# Patient Record
Sex: Female | Born: 1945 | Race: White | Hispanic: No | Smoking: Never smoker
Health system: Southern US, Community
[De-identification: ages and names within clinical notes are randomized; demographics above are authoritative.]

## PROBLEM LIST (undated history)

## (undated) DIAGNOSIS — R7303 Prediabetes: Secondary | ICD-10-CM

## (undated) DIAGNOSIS — K760 Fatty (change of) liver, not elsewhere classified: Secondary | ICD-10-CM

## (undated) DIAGNOSIS — Z8719 Personal history of other diseases of the digestive system: Secondary | ICD-10-CM

## (undated) DIAGNOSIS — I10 Essential (primary) hypertension: Secondary | ICD-10-CM

## (undated) DIAGNOSIS — E559 Vitamin D deficiency, unspecified: Secondary | ICD-10-CM

## (undated) DIAGNOSIS — K635 Polyp of colon: Secondary | ICD-10-CM

## (undated) DIAGNOSIS — K922 Gastrointestinal hemorrhage, unspecified: Secondary | ICD-10-CM

## (undated) DIAGNOSIS — M1990 Unspecified osteoarthritis, unspecified site: Secondary | ICD-10-CM

## (undated) DIAGNOSIS — E669 Obesity, unspecified: Secondary | ICD-10-CM

## (undated) HISTORY — DX: Prediabetes: R73.03

## (undated) HISTORY — PX: FOOT SURGERY: SHX648

## (undated) HISTORY — PX: BACK SURGERY: SHX140

## (undated) HISTORY — DX: Fatty (change of) liver, not elsewhere classified: K76.0

## (undated) HISTORY — DX: Polyp of colon: K63.5

## (undated) HISTORY — DX: Obesity, unspecified: E66.9

## (undated) HISTORY — PX: ROTATOR CUFF REPAIR: SHX139

## (undated) HISTORY — DX: Gastrointestinal hemorrhage, unspecified: K92.2

## (undated) HISTORY — PX: THYROID SURGERY: SHX805

## (undated) HISTORY — PX: ABDOMINAL HYSTERECTOMY: SHX81

## (undated) HISTORY — PX: HERNIA REPAIR: SHX51

---

## 2011-01-09 ENCOUNTER — Ambulatory Visit: Payer: Self-pay | Admitting: Unknown Physician Specialty

## 2011-01-12 ENCOUNTER — Ambulatory Visit: Payer: Self-pay | Admitting: Otolaryngology

## 2012-04-18 ENCOUNTER — Ambulatory Visit: Payer: Self-pay | Admitting: Podiatry

## 2012-05-02 ENCOUNTER — Ambulatory Visit: Payer: Self-pay | Admitting: Podiatry

## 2012-06-27 ENCOUNTER — Ambulatory Visit: Payer: Self-pay | Admitting: Anesthesiology

## 2012-06-27 DIAGNOSIS — Z01810 Encounter for preprocedural cardiovascular examination: Secondary | ICD-10-CM

## 2012-07-02 ENCOUNTER — Ambulatory Visit: Payer: Self-pay | Admitting: Podiatry

## 2012-07-18 ENCOUNTER — Ambulatory Visit: Payer: Self-pay | Admitting: Podiatry

## 2012-12-01 DEATH — deceased

## 2014-09-16 DIAGNOSIS — M75121 Complete rotator cuff tear or rupture of right shoulder, not specified as traumatic: Secondary | ICD-10-CM | POA: Insufficient documentation

## 2014-09-16 DIAGNOSIS — M65811 Other synovitis and tenosynovitis, right shoulder: Secondary | ICD-10-CM | POA: Insufficient documentation

## 2014-09-22 ENCOUNTER — Ambulatory Visit: Payer: Self-pay | Admitting: Surgery

## 2014-10-06 ENCOUNTER — Ambulatory Visit
Admit: 2014-10-06 | Disposition: A | Discharge status: Home / Self Care | Payer: Self-pay | Attending: Surgery | Admitting: Surgery

## 2014-10-06 LAB — CBC WITH DIFFERENTIAL/PLATELET
Basophil #: 0.1 10*3/uL (ref 0.0–0.1)
Basophil %: 2 %
Eosinophil #: 0.1 10*3/uL (ref 0.0–0.7)
Eosinophil %: 2 %
HCT: 40.7 % (ref 35.0–47.0)
HGB: 13 g/dL (ref 12.0–16.0)
LYMPHS PCT: 20 %
Lymphocyte #: 1.1 10*3/uL (ref 1.0–3.6)
MCH: 28.1 pg (ref 26.0–34.0)
MCHC: 31.9 g/dL — ABNORMAL LOW (ref 32.0–36.0)
MCV: 88 fL (ref 80–100)
MONOS PCT: 11.5 %
Monocyte #: 0.6 x10 3/mm (ref 0.2–0.9)
Neutrophil #: 3.5 10*3/uL (ref 1.4–6.5)
Neutrophil %: 64.5 %
Platelet: 213 10*3/uL (ref 150–440)
RBC: 4.63 10*6/uL (ref 3.80–5.20)
RDW: 14 % (ref 11.5–14.5)
WBC: 5.4 10*3/uL (ref 3.6–11.0)

## 2014-10-13 ENCOUNTER — Ambulatory Visit
Admit: 2014-10-13 | Disposition: A | Discharge status: Home / Self Care | Payer: Self-pay | Attending: Surgery | Admitting: Surgery

## 2014-10-15 DIAGNOSIS — M7541 Impingement syndrome of right shoulder: Secondary | ICD-10-CM | POA: Insufficient documentation

## 2014-11-01 NOTE — Op Note (Signed)
PATIENT NAME:  Taylor Morrow, Taylor Morrow MR#:  [id number] DATE OF BIRTH:  1946-02-12  DATE OF PROCEDURE:  10/13/2014  PREOPERATIVE DIAGNOSIS: Massive rotator cuff tear, right shoulder.   POSTOPERATIVE DIAGNOSIS: Massive rotator cuff tear, right shoulder.   PROCEDURE: Arthroscopic subscapularis tendon repair, arthroscopic subacromial decompression, mini-open repair of massive rotator cuff tear, and mini-open repair of biceps tenodesis, right shoulder.  SURGEON: Pascal Lux, M.D.   ANESTHESIA: General endotracheal with an interscalene block, placed by the anesthesiologist.   FINDINGS: As noted above.   COMPLICATIONS: None.   ESTIMATED BLOOD LOSS: Minimal.   TOTAL FLUIDS: 1300 mL of Crystalloid.  TOURNIQUET: None.   DRAINS: None.   CLOSURE: Staples.   BRIEF CLINICAL NOTE: The patient is a 69 year old female with a 6 plus month history of right shoulder pain. Her symptoms have persisted despite medications, activity modification, etc. History and examination consistent with a massive rotator cuff tear, confirmed by MRI scan. She presents at this time for a right shoulder arthroscopy, debridement, decompression, and repair of the massive rotator cuff tear.   DESCRIPTION OF PROCEDURE: The patient underwent placement of an interscalene block in the preoperative holding area. After the block was placed, she had a short burst of approximately 20 beats of V. tach. She was asymptomatic during this event. A stat EKG was obtained which was completely unchanged as compared to her preoperative EKG. Furthermore, after assessment by the anesthesiologist and discussion with the cardiologist on call, clearance was given and she was brought back to the operating room where she was lain in the supine position. After adequate general endotracheal intubation and anesthesia were obtained, she was repositioned in the beach chair position using the beach chair positioner. The right shoulder and upper extremity were  prepped with ChloraPrep solution before being draped sterilely. Preoperative antibiotics were administered. The expected portal sites and incision site were injected with 0.5% Sensorcaine with epinephrine before the camera was placed in the posterior portal. The glenohumeral joint was thoroughly inspected with the findings as described above. An anterior portal was created using an outside in technique. The labrum was probed and found to be intact. There was some fraying anteriorly and superiorly, which was debrided lightly with the full radius resector. Abundant reactive synovial tissue was also debrided using the full radius resector. The large tear of the rotator cuff involving the supraspinatus and infraspinatus tendons was readily identified. In addition, there was a tear of the superior portion of the subscapularis tendon. This area was debrided using the full radius resector. The exposed lesser tuberosity was roughened with a 4 mm acromionizing burr before the torn portion of this tendon was repaired using a single BioKnotless anchor. Subsequent probing of the repair demonstrated excellent stability. The repair also was stable to external rotation of the shoulder. Because the subscapularis tendon had been torn, it was elected to proceed with a biceps tenodesis. Therefore, the biceps tendon was released from its labral anchor using the ArthroCare wand. The instruments were removed from the joint after suctioning the excess fluid.   The camera was repositioned through the posterior portal into the subacromial space. A separate lateral portal was created. The shaver was introduced and a subtotal bursectomy was performed. The periosteal tissues were debrided off the undersurface of the anterior third of the acromion using the ArthroCare wand. The ArthroCare wand also was used to recess the coracoacromial ligament from its attachment along the anterior and lateral margins of the acromion. The acromioplasty was  completed by using  the 4 mm acromionizing burr and removing the undersurface of the anterior third of the acromion. The ArthroCare wand was reintroduced to obtain hemostasis. The instruments were then removed from the subacromial space after suctioning the excess fluid.   An approximately 4 to 5 cm incision was made over the anterolateral aspect of the shoulder, incorporating the superolateral portal site, which had been created using an outside in technique to help with repairing the subscapularis tendon. The incision was carried down through the subcutaneous tissues to expose the deltoid fascia. The raphe between the anterior and middle thirds was developed to provide access into the subacromial space. Additional bursal tissues were debrided sharply with Metzenbaum scissors. The rotator cuff tear was readily identified. The torn margins were debrided with a 15 blade, as was the exposed greater tuberosity. A rongeur was used to lightly roughen the bone to stimulate bleeding and hopefully optimize the healing response of the rotator cuff tear. The tear was configured where it was primarily a horizontal tear where both the supraspinatus and infraspinatus were torn off their insertion of the greater tuberosity. There also was a longitudinal component between the infraspinatus and teres minor. This defect was repaired in side-to-side fashion using #0 Ethibond interrupted sutures. The remainder of the tear was repaired to the greater tuberosity using 3 Biomet 2.9 mm JuggerKnot anchors. Each of the 2 sets of sutures from each anchor were passed through the torn margin and tied securely to effect the repair. Several of these sutures were then brought back out laterally through bone tunnels and tied over bony bridges to reinforce the repair, creating a double row repair. An apparent watertight closure was obtained.   The bicipital groove was identified by palpation. It was opened for approximately 1 to 1.5 cm. The  biceps tendon was retrieved through this defect. The floor of the bicipital groove was roughened with a curette before a fourth Biomet 2.9 mm JuggerKnot anchor was inserted. Both sets of sutures were passed through the tendon and tied securely to effect the tenodesis. The excess biceps tendon was removed with a #15 blade before the bicipital sheath was repaired using a single #0 Ethibond interrupted suture, incorporating the biceps tendon to further reinforce the tenodesis.   The wound was copiously irrigated with sterile saline solution before the deltoid raphe was reapproximated using 2-0 Vicryl interrupted sutures. The subcutaneous tissues were closed in 2 layers using 2-0 Vicryl interrupted sutures before the skin was closed using staples. The portal sites also were closed using staples. A sterile bulky dressing was applied to the shoulder before the arm was placed into a shoulder immobilizer. The patient was then awakened, extubated, and returned to the recovery room in satisfactory condition after tolerating the procedure well.  ____________________________ J. Dorien Chihuahua, MD jjp:sb D: 10/13/2014 12:04:25 ET T: 10/13/2014 13:52:18 ET JOB#: [id number]  cc: Pascal Lux, MD, <Dictator> Pascal Lux MD ELECTRONICALLY SIGNED 10/20/2014 14:46

## 2015-08-05 ENCOUNTER — Encounter: Payer: Self-pay | Admitting: Emergency Medicine

## 2015-08-05 ENCOUNTER — Inpatient Hospital Stay
Admission: EM | Admit: 2015-08-05 | Discharge: 2015-08-07 | DRG: 378 | Disposition: A | Discharge status: Home / Self Care | Payer: Medicare Other | Attending: Internal Medicine | Admitting: Internal Medicine

## 2015-08-05 ENCOUNTER — Observation Stay: Payer: Medicare Other

## 2015-08-05 DIAGNOSIS — K559 Vascular disorder of intestine, unspecified: Secondary | ICD-10-CM | POA: Diagnosis present

## 2015-08-05 DIAGNOSIS — R197 Diarrhea, unspecified: Secondary | ICD-10-CM

## 2015-08-05 DIAGNOSIS — K625 Hemorrhage of anus and rectum: Secondary | ICD-10-CM | POA: Diagnosis present

## 2015-08-05 DIAGNOSIS — E860 Dehydration: Secondary | ICD-10-CM | POA: Diagnosis present

## 2015-08-05 DIAGNOSIS — Z8249 Family history of ischemic heart disease and other diseases of the circulatory system: Secondary | ICD-10-CM

## 2015-08-05 DIAGNOSIS — R109 Unspecified abdominal pain: Secondary | ICD-10-CM

## 2015-08-05 DIAGNOSIS — Z79899 Other long term (current) drug therapy: Secondary | ICD-10-CM | POA: Diagnosis not present

## 2015-08-05 DIAGNOSIS — I10 Essential (primary) hypertension: Secondary | ICD-10-CM | POA: Diagnosis present

## 2015-08-05 DIAGNOSIS — F22 Delusional disorders: Secondary | ICD-10-CM

## 2015-08-05 DIAGNOSIS — K922 Gastrointestinal hemorrhage, unspecified: Secondary | ICD-10-CM | POA: Diagnosis present

## 2015-08-05 HISTORY — DX: Essential (primary) hypertension: I10

## 2015-08-05 LAB — CBC WITH DIFFERENTIAL/PLATELET
Basophils Absolute: 0.1 10*3/uL (ref 0–0.1)
Basophils Relative: 1 %
Eosinophils Absolute: 0.1 10*3/uL (ref 0–0.7)
Eosinophils Relative: 1 %
HEMATOCRIT: 39.5 % (ref 35.0–47.0)
Hemoglobin: 13.3 g/dL (ref 12.0–16.0)
LYMPHS ABS: 1.4 10*3/uL (ref 1.0–3.6)
LYMPHS PCT: 16 %
MCH: 28.8 pg (ref 26.0–34.0)
MCHC: 33.7 g/dL (ref 32.0–36.0)
MCV: 85.5 fL (ref 80.0–100.0)
MONO ABS: 1 10*3/uL — AB (ref 0.2–0.9)
MONOS PCT: 12 %
NEUTROS ABS: 5.9 10*3/uL (ref 1.4–6.5)
Neutrophils Relative %: 70 %
Platelets: 238 10*3/uL (ref 150–440)
RBC: 4.63 MIL/uL (ref 3.80–5.20)
RDW: 13.6 % (ref 11.5–14.5)
WBC: 8.4 10*3/uL (ref 3.6–11.0)

## 2015-08-05 LAB — COMPREHENSIVE METABOLIC PANEL
ALT: 16 U/L (ref 14–54)
ANION GAP: 9 (ref 5–15)
AST: 20 U/L (ref 15–41)
Albumin: 3.9 g/dL (ref 3.5–5.0)
Alkaline Phosphatase: 97 U/L (ref 38–126)
BILIRUBIN TOTAL: 0.8 mg/dL (ref 0.3–1.2)
BUN: 21 mg/dL — AB (ref 6–20)
CO2: 24 mmol/L (ref 22–32)
Calcium: 9.4 mg/dL (ref 8.9–10.3)
Chloride: 105 mmol/L (ref 101–111)
Creatinine, Ser: 0.93 mg/dL (ref 0.44–1.00)
GFR calc Af Amer: >60 mL/min (ref >60)
Glucose, Bld: 103 mg/dL — ABNORMAL HIGH (ref 65–99)
POTASSIUM: 4.1 mmol/L (ref 3.5–5.1)
Sodium: 138 mmol/L (ref 135–145)
TOTAL PROTEIN: 7.5 g/dL (ref 6.5–8.1)

## 2015-08-05 LAB — HEMOGLOBIN AND HEMATOCRIT, BLOOD
HCT: 37.6 % (ref 35.0–47.0)
HCT: 37.4 % (ref 35.0–47.0)
HEMOGLOBIN: 12.2 g/dL (ref 12.0–16.0)
Hemoglobin: 12.6 g/dL (ref 12.0–16.0)

## 2015-08-05 LAB — LIPASE, BLOOD: LIPASE: 27 U/L (ref 11–51)

## 2015-08-05 LAB — ABO/RH: ABO/RH(D): A NEG

## 2015-08-05 MED ORDER — PIPERACILLIN-TAZOBACTAM 3.375 G IVPB
3.3750 g | Freq: Three times a day (TID) | INTRAVENOUS | Status: DC
  Administered 2015-08-05 – 2015-08-07 (×5): 3.375 g via INTRAVENOUS
  Filled 2015-08-05 (×8): qty 50

## 2015-08-05 MED ORDER — SODIUM CHLORIDE 0.9 % IV SOLN
INTRAVENOUS | Status: DC
  Administered 2015-08-05 – 2015-08-06 (×3): via INTRAVENOUS

## 2015-08-05 MED ORDER — ACETAMINOPHEN 325 MG PO TABS: 650.0000 mg | ORAL_TABLET | Freq: Four times a day (QID) | ORAL | Status: DC | PRN

## 2015-08-05 MED ORDER — ONDANSETRON HCL 4 MG/2ML IJ SOLN: 4.0000 mg | Freq: Four times a day (QID) | INTRAMUSCULAR | Status: DC | PRN

## 2015-08-05 MED ORDER — IOHEXOL 240 MG/ML SOLN
25.0000 mL | INTRAMUSCULAR | Status: AC
  Administered 2015-08-05 (×2): 25 mL via ORAL

## 2015-08-05 MED ORDER — ACETAMINOPHEN 650 MG RE SUPP: 650.0000 mg | Freq: Four times a day (QID) | RECTAL | Status: DC | PRN

## 2015-08-05 MED ORDER — ONDANSETRON HCL 4 MG PO TABS: 4.0000 mg | ORAL_TABLET | Freq: Four times a day (QID) | ORAL | Status: DC | PRN

## 2015-08-05 MED ORDER — IOHEXOL 300 MG/ML  SOLN
100.0000 mL | Freq: Once | INTRAMUSCULAR | Status: AC | PRN
  Administered 2015-08-05: 100 mL via INTRAVENOUS

## 2015-08-05 MED ORDER — MORPHINE SULFATE (PF) 2 MG/ML IV SOLN: 2.0000 mg | INTRAVENOUS | Status: DC | PRN

## 2015-08-05 MED ORDER — PANTOPRAZOLE SODIUM 40 MG IV SOLR
40.0000 mg | Freq: Two times a day (BID) | INTRAVENOUS | Status: DC
  Administered 2015-08-05 – 2015-08-07 (×5): 40 mg via INTRAVENOUS
  Filled 2015-08-05 (×5): qty 40

## 2015-08-05 NOTE — ED Notes (Signed)
Called floor to let them know pt on the way up 

## 2015-08-05 NOTE — Consult Note (Signed)
Patient had CT scan showing changes consistent with ischemic colitis.  Should have iv antibiotics for a few days then oral.  Recommend vascular surgery routine consult for tomorrow for follow up and possible Doppler studies of abdomen.

## 2015-08-05 NOTE — Progress Notes (Signed)
[facility] [place] NAME: Taylor Morrow    MR#:  [id number]  DATE OF BIRTH:  January 20, 1946  SUBJECTIVE:   Pt. Here due to rectal bleeding.  No bleeding this a.m. No abdominal pain, N/V.  Hg. Stable.   REVIEW OF SYSTEMS:    Review of Systems  Constitutional: Negative for fever and chills.  HENT: Negative for congestion and tinnitus.   Eyes: Negative for blurred vision and double vision.  Respiratory: Negative for cough, shortness of breath and wheezing.   Cardiovascular: Negative for chest pain, orthopnea and PND.  Gastrointestinal: Positive for blood in stool. Negative for nausea, vomiting, abdominal pain and diarrhea.  Genitourinary: Negative for dysuria and hematuria.  Neurological: Negative for dizziness, sensory change and focal weakness.  All other systems reviewed and are negative.   Nutrition: Clear liquids Tolerating Diet: Yes Tolerating PT:  Ambulatory  DRUG ALLERGIES:  No Known Allergies  VITALS:  Blood pressure 110/68, pulse 86, temperature 98.2 F (36.8 C), temperature source Oral, resp. rate 18, height 5\' 1"  (1.549 m), weight 90.039 kg (198 lb 8 oz), SpO2 96 %.  PHYSICAL EXAMINATION:   Physical Exam  GENERAL:  70 y.o.-year-old patient lying in the bed with no acute distress.  EYES: Pupils equal, round, reactive to light and accommodation. No scleral icterus. Extraocular muscles intact.  HEENT: Head atraumatic, normocephalic. Oropharynx and nasopharynx clear.  NECK:  Supple, no jugular venous distention. No thyroid enlargement, no tenderness.  LUNGS: Normal breath sounds bilaterally, no wheezing, rales, rhonchi. No use of accessory muscles of respiration.  CARDIOVASCULAR: S1, S2 normal. No murmurs, rubs, or gallops.  ABDOMEN: Soft, nontender, nondistended. Bowel sounds present. No organomegaly or mass.  EXTREMITIES: No cyanosis, clubbing or edema b/l.    NEUROLOGIC: Cranial nerves II through XII are intact. No  focal Motor or sensory deficits b/l.   PSYCHIATRIC: The patient is alert and oriented x 3.  SKIN: No obvious rash, lesion, or ulcer.    LABORATORY PANEL:   CBC  Recent Labs Lab 08/05/15 0500 08/05/15 0939  WBC 8.4  --   HGB 13.3 12.6  HCT 39.5 37.6  PLT 238  --    ------------------------------------------------------------------------------------------------------------------  Chemistries   Recent Labs Lab 08/05/15 0500  NA 138  K 4.1  CL 105  CO2 24  GLUCOSE 103*  BUN 21*  CREATININE 0.93  CALCIUM 9.4  AST 20  ALT 16  ALKPHOS 97  BILITOT 0.8   ------------------------------------------------------------------------------------------------------------------  Cardiac Enzymes No results for input(s): TROPONINI in the last 168 hours. ------------------------------------------------------------------------------------------------------------------  RADIOLOGY:  No results found.   ASSESSMENT AND PLAN:   70 year old female with past medical history of hypertension presents to the hospital due to multiple episodes of rectal bleeding.  #1 GI bleed-this is a lower GI bleed given the patient's rectal bleeding. -Suspect this is likely diverticular/hemorrhoidal in nature. Hemoglobin stable. -I will place the patient on clear liquid diet.  -Patient hasn't seen by gastroenterology and they will order a CT scan abdomen and pelvis. -Patient's husband was just treated for C. difficile colitis but I do not clinically suspect that the patient has underlying C. difficile and she is afebrile, has no abdominal pain.    #2 hypertension-blood pressure in the low side. Hold lisinopril for now.    All the records are reviewed and case discussed with Care Management/Social Workerr. Management plans discussed with the patient, family and they are in agreement.  CODE STATUS: Full  DVT Prophylaxis:  Teds and SCDs  TOTAL TIME TAKING CARE OF THIS PATIENT: 30 minutes.   POSSIBLE  D/C IN 1-2 DAYS, DEPENDING ON CLINICAL CONDITION.   Henreitta Leber M.D on 08/05/2015 at 2:06 PM  Between 7am to 6pm - Pager - [phone]  After 6pm go to [email or web address] - password [email or web address] [facility]  Office  [phone]  CC: Primary care physician; Pcp Not In System

## 2015-08-05 NOTE — Consult Note (Signed)
Consultation  Referring Provider:Dr. Pyreddy Primary Care Physician:  Dr. Celso Sickle [facility] Consulting  Gastroenterologist:     Dr. Vira Agar    Reason for Consultation: GI bleed        HPI:   Taylor Morrow is a 70 y.o. female with a known history of hypertension came to the emergency room with complaints of bleeding per rectum since yesterday. She reports onset  yesterday at 12:30 in the afternoon of cramps in the lower stomach bilateral area.  She immediately felt the need to sit on the toilet.  She had an episode of diarrhea, and vomited once.  She continued to have diarrhea associated with "severe gas pains rated 8 out of 10."  This pain almost doubled her over.  She immediately became diaphoretic, weak, felt hot and her husband had to help her  back to the couch.  She had continued episodes of diarrhea, and she was hurting at the lower part of her stomach.  She started to see a fresh blood at 7:30 PM that went on through 10:30 PM.  She went to the emergency room.  She thinks she passed blood 4 times.  Last episode was 0230.  She has less discomfort now but there is a little crampiness, there is tenesmus.  She has been back and forth to the toilet a couple times without results.  A stool study as has been ordered. Hgb 13.3 to 12.2.  Patient has had repeated antibiotics in December for a cold.  She never got diarrhea. Her husband was treated for C difficile 3 weeks ago.  Patient says she was passing formed normal stool up until this acute event yesterday.  She does not believe she has C. difficile. She has been taking ibuprofen 2 pills at night and sometimes in the middle of the night for 2 weeks. She is careful to eat a cracker with it. Yesterday, she ate chicken that she had prepared, and it did sit out for 2 hours before she put it away but she does not believe it was bad.  There has  been no ill contacts.  No medication changes.  Reports history of colon polyps with serial  colonoscopies most recent study 2013 in  [place].  She is due for repeat surveillance colonoscopy in 2018.  She has been feeling well up until yesterday   Past Medical History  Diagnosis Date  . Hypertension     Past Surgical History  Procedure Laterality Date  . Thyroid surgery    . Back surgery    . Rotator cuff repair Right     Family History  Problem Relation Age of Onset  . Hypertension Mother      Social History  Substance Use Topics  . Smoking status: Never Smoker   . Smokeless tobacco: None  . Alcohol Use: No    Prior to Admission medications   Medication Sig Start Date End Date Taking? Authorizing Provider  lisinopril (PRINIVIL,ZESTRIL) 10 MG tablet Take 10 mg by mouth daily.   Yes Historical Provider, MD    Current Facility-Administered Medications  Medication Dose Route Frequency Provider Last Rate Last Dose  . 0.9 %  sodium chloride infusion   Intravenous Continuous Saundra Shelling, MD 125 mL/hr at 08/05/15 0908    . acetaminophen (TYLENOL) tablet 650 mg  650 mg Oral Q6H PRN Saundra Shelling, MD       Or  . acetaminophen (TYLENOL) suppository 650 mg  650 mg Rectal Q6H PRN Pavan Pyreddy,  MD      . morphine 2 MG/ML injection 2 mg  2 mg Intravenous Q4H PRN Pavan Pyreddy, MD      . ondansetron (ZOFRAN) tablet 4 mg  4 mg Oral Q6H PRN Saundra Shelling, MD       Or  . ondansetron (ZOFRAN) injection 4 mg  4 mg Intravenous Q6H PRN Pavan Pyreddy, MD      . pantoprazole (PROTONIX) injection 40 mg  40 mg Intravenous Q12H Pavan Pyreddy, MD   40 mg at 08/05/15 1106    Allergies as of 08/05/2015  . (No Known Allergies)     Review of Systems:    A 12 system review was obtained and all negative except where noted in HPI.    Physical Exam:  Vital signs in last 24 hours: Temp:  [97.8 F (36.6 C)-98.2 F (36.8 C)] 98.2 F (36.8 C) (02/02 1243) Pulse Rate:  [72-95] 86 (02/02 1243) Resp:  [17-20] 18 (02/02 1243) BP: (110-135)/(68-87) 110/68 mmHg (02/02 1243) SpO2:  [94  %-97 %] 96 % (02/02 1243) Weight:  [88.451 kg (195 lb)-90.039 kg (198 lb 8 oz)] 90.039 kg (198 lb 8 oz) (02/02 [id number]) Last BM Date: 08/05/15  General:  Well-developed, well-nourished and in no acute distress Head:  Head without obvious abnormality, atraumatic  Eyes:   Conjunctiva pink, sclera anicteric   ENT:   Mouth free of lesions, mucosa moist  Neck:   Supple w/o thyromegaly or mass, trachea midline, no adenopathy  Lungs: Clear to auscultation bilaterally, respirations unlabored Heart:     Normal S1S2, no rubs, murmurs, gallops. Abdomen: Soft,mild diffuse lower tenderness, no hepatosplenomegaly, hernia, or mass and BS normal Rectal: Deferred Lymph:  No cervical or supraclavicular adenopathy. Extremities:   No edema, cyanosis, or clubbing Skin  Skin color, texture, turgor normal, no rashes or lesions Neuro:  A&O x 3. CNII-XII intact, normal strength Psych:  Appropriate mood and affect.  Data Reviewed:  LAB RESULTS:  Recent Labs  08/05/15 0500 08/05/15 0939 08/05/15 1429  WBC 8.4  --   --   HGB 13.3 12.6 12.2  HCT 39.5 37.6 37.4  PLT 238  --   --    BMET  Recent Labs  08/05/15 0500  NA 138  K 4.1  CL 105  CO2 24  GLUCOSE 103*  BUN 21*  CREATININE 0.93  CALCIUM 9.4   LFT  Recent Labs  08/05/15 0500  PROT 7.5  ALBUMIN 3.9  AST 20  ALT 16  ALKPHOS 97  BILITOT 0.8   PT/INR No results for input(s): LABPROT, INR in the last 72 hours.  STUDIES: Ct Abdomen Pelvis W Contrast  08/05/2015  CLINICAL DATA:  Bleeding per rectum since yesterday. Nausea. abdominal pain. EXAM: CT ABDOMEN AND PELVIS WITH CONTRAST TECHNIQUE: Multidetector CT imaging of the abdomen and pelvis was performed using the standard protocol following bolus administration of intravenous contrast. CONTRAST:  159mL OMNIPAQUE IOHEXOL 300 MG/ML  SOLN COMPARISON:  None. FINDINGS: Lower chest: Scarring at both lung bases. Borderline cardiomegaly, without pericardial or pleural effusion. Surgical changes  at the gastroesophageal junction. Hepatobiliary: Subcentimeter right hepatic lobe low-density lesion is well-circumscribed and likely a cyst. Normal gallbladder, without biliary ductal dilatation. Pancreas: Normal, without mass or ductal dilatation. Spleen: Normal in size, without focal abnormality. Adrenals/Urinary Tract: Normal left adrenal gland. Mild right adrenal nodularity. An upper pole left renal lesion is too small to characterize. A lower pole left renal lesion measures 3.4 cm and is consistent with a cyst.  No hydronephrosis. Minimal air in the nondependent bladder. Stomach/Bowel: Proximal gastric underdistention. Scattered colonic diverticula. Wall thickening involves the splenic flexure and descending colon. This is moderate. Mild surrounding edema. Normal terminal ileum. Appendix not visualized, but no pericecal inflammation identified. Normal small bowel. Vascular/Lymphatic: Normal caliber of the aorta and branch vessels. Celiac and mesenteric vessels patent, without significant atherosclerosis. No venous thrombus identified. No abdominopelvic adenopathy. Reproductive: Normal uterus and adnexa. Other: No significant free fluid. No free intraperitoneal air. pelvic floor laxity. Musculoskeletal: Degenerative disc disease at the lumbosacral junction. S shaped thoracolumbar spine curvature. IMPRESSION: 1. Left-sided colitis. Clinical history and distribution favor ischemia. No evidence arterial thrombus or venous occlusion. 2. Air within the urinary bladder, likely iatrogenic. Correlate with instrumentation. Electronically Signed   By: Abigail Miyamoto M.D.   On: 08/05/2015 15:33     Assessment:  Taylor Morrow is a 70 y.o. with a known history of hypertension came to the emergency room with complaints of lower abdominal cramps, diarrhea,  bleeding per rectum since yesterday. She had diaphorase, weakness and 8/10 cramps bilateral  preceeding the bleeding. She has HTN.  Patient had nausea and one episode of  vomiting.   Plan:  R/O ischemic colitis, obtain studies and GI panel ordered. Further orders pending.   This case was discussed with Dr. Manya Silvas in collaboration of care. Thank you for the consultation.  These services provided by Denice Paradise RN, MSN, ANP-BC under collaborative practice agreement with Manya Silvas, MD.  08/05/2015, 3:52 PM

## 2015-08-05 NOTE — ED Notes (Signed)
Patient ambulatory to triage with steady gait, without difficulty or distress noted; pt reports since yesterday having diarrhea, now bloody with lower abd pain; denies hx of same

## 2015-08-05 NOTE — H&P (Addendum)
[facility] [place] NAME: Taylor Morrow    MR#:  [id number]  DATE OF BIRTH:  1946/06/12  DATE OF ADMISSION:  08/05/2015  PRIMARY CARE PHYSICIAN: Pcp Not In System   REQUESTING/REFERRING PHYSICIAN:   CHIEF COMPLAINT:   Chief Complaint  Patient presents with  . Rectal Bleeding    HISTORY OF PRESENT ILLNESS: Taylor Morrow  is a 70 y.o. female with a known history of hypertension came to the emergency room with complaints of bleeding per rectum since yesterday. Patient had nausea since yesterday. No complaints of any vomiting. She noticed 3-4 episodes of bleeding per rectum since yesterday. It was accompanied by cramping abdominal pain. Yesterday patient had loose stool and later on further episodes she just had bright red blood per rectum. The cramping abdominal pain is 4 out of 10 on a scale of 1-10. No radiation of the pain noted. Patient last had colonoscopy in 2013 which was a normal study. Colonoscopy done prior to that, she had polyps removed. Patient takes ibuprofen daily at nighttime for the last 2 weeks for pain. No history of headache, dizziness, blurry vision. No history of syncope or seizure. During the workup in the emergency room patient's hemoglobin around 13. Patient's husband was recently treated for C. difficile colitis and completed the course of medication 3 weeks ago.   PAST MEDICAL HISTORY:   Past Medical History  Diagnosis Date  . Hypertension     PAST SURGICAL HISTORY: Past Surgical History  Procedure Laterality Date  . Thyroid surgery    . Back surgery    . Rotator cuff repair Right     SOCIAL HISTORY:  Social History  Substance Use Topics  . Smoking status: Never Smoker   . Smokeless tobacco: Not on file  . Alcohol Use: No    FAMILY HISTORY:  Family History  Problem Relation Age of Onset  . Hypertension Mother     DRUG ALLERGIES: No Known Allergies  REVIEW OF SYSTEMS:   CONSTITUTIONAL: No fever,  has weakness.  EYES: No blurred or double vision.  EARS, NOSE, AND THROAT: No tinnitus or ear pain.  RESPIRATORY: No cough, shortness of breath, wheezing or hemoptysis.  CARDIOVASCULAR: No chest pain, orthopnea, edema.  GASTROINTESTINAL: Has nausea, no vomiting, had two episodes of diarrhea and crampy abdominal pain noted. GENITOURINARY: No dysuria, hematuria.  ENDOCRINE: No polyuria, nocturia,  HEMATOLOGY: Has rectal bleed. SKIN: No rash or lesion. MUSCULOSKELETAL: No joint pain or arthritis.   NEUROLOGIC: No tingling, numbness, weakness.  PSYCHIATRY: No anxiety or depression.   MEDICATIONS AT HOME:  Prior to Admission medications   Medication Sig Start Date End Date Taking? Authorizing Provider  lisinopril (PRINIVIL,ZESTRIL) 10 MG tablet Take 10 mg by mouth daily.   Yes Historical Provider, MD      PHYSICAL EXAMINATION:   VITAL SIGNS: Blood pressure 135/85, pulse 72, temperature 98 F (36.7 C), temperature source Oral, resp. rate 20, height 5\' 1"  (1.549 m), weight 88.451 kg (195 lb), SpO2 97 %.  GENERAL:  70 y.o.-year-old patient lying in the bed with no acute distress.  EYES: Pupils equal, round, reactive to light and accommodation. No scleral icterus.No pallor noted. Extraocular muscles intact.  HEENT: Head atraumatic, normocephalic. Oropharynx and nasopharynx clear.  NECK:  Supple, no jugular venous distention. No thyroid enlargement, no tenderness.  LUNGS: Normal breath sounds bilaterally, no wheezing, rales,rhonchi or crepitation. No use of accessory muscles of respiration.  CARDIOVASCULAR: S1, S2 normal. No murmurs,  rubs, or gallops.  ABDOMEN: Soft, nontender, nondistended. Decreased Bowel sounds noted.No organomegaly or mass.  EXTREMITIES: No pedal edema, cyanosis, or clubbing.  NEUROLOGIC: Cranial nerves II through XII are intact. Muscle strength 5/5 in all extremities. Sensation intact. Gait not checked.  PSYCHIATRIC: The patient is alert and oriented x 3.  SKIN: No  obvious rash, lesion, or ulcer.   LABORATORY PANEL:   CBC  Recent Labs Lab 08/05/15 0500  WBC 8.4  HGB 13.3  HCT 39.5  PLT 238  MCV 85.5  MCH 28.8  MCHC 33.7  RDW 13.6  LYMPHSABS 1.4  MONOABS 1.0*  EOSABS 0.1  BASOSABS 0.1   ------------------------------------------------------------------------------------------------------------------  Chemistries   Recent Labs Lab 08/05/15 0500  NA 138  K 4.1  CL 105  CO2 24  GLUCOSE 103*  BUN 21*  CREATININE 0.93  CALCIUM 9.4  AST 20  ALT 16  ALKPHOS 97  BILITOT 0.8   ------------------------------------------------------------------------------------------------------------------ estimated creatinine clearance is 56.1 mL/min (by C-G formula based on Cr of 0.93). ------------------------------------------------------------------------------------------------------------------ No results for input(s): TSH, T4TOTAL, T3FREE, THYROIDAB in the last 72 hours.  Invalid input(s): FREET3   Coagulation profile No results for input(s): INR, PROTIME in the last 168 hours. ------------------------------------------------------------------------------------------------------------------- No results for input(s): DDIMER in the last 72 hours. -------------------------------------------------------------------------------------------------------------------  Cardiac Enzymes No results for input(s): CKMB, TROPONINI, MYOGLOBIN in the last 168 hours.  Invalid input(s): CK ------------------------------------------------------------------------------------------------------------------ Invalid input(s): POCBNP  ---------------------------------------------------------------------------------------------------------------  Urinalysis No results found for: COLORURINE, APPEARANCEUR, LABSPEC, PHURINE, GLUCOSEU, HGBUR, BILIRUBINUR, KETONESUR, PROTEINUR, UROBILINOGEN, NITRITE, LEUKOCYTESUR   RADIOLOGY: No results found.  EKG: Orders  placed or performed in visit on 06/27/12  . EKG 12-Lead    IMPRESSION AND PLAN: 70 year old female patient with history of hypertension presented to the emergency room with abdominal pain, nausea and rectal bleed. Admitting diagnosis 1. Acute gastrointestinal bleeding 2. Dehydration 3. Abdominal pain 4. Hypertension 5. Possible NSAID-induced gastritis. Treatment plan Admit patient to medical floor under observation IV fluid hydration IV Protonix 40 mg every 12 hourly Gastroenterology consultation for possible EGD and colonoscopy Hold blood thinner medication Serial hemoglobin and hematocrit monitoring Antiemetics Pain management with morphine intravenously as needed PRBC transfusion if needed Supportive care.  All the records are reviewed and case discussed with ED provider. Management plans discussed with the patient, family and they are in agreement.  CODE STATUS:FULL Code Status History    This patient does not have a recorded code status. Please follow your organizational policy for patients in this situation.       TOTAL TIME TAKING CARE OF THIS PATIENT: 50 minutes.    Saundra Shelling M.D on 08/05/2015 at 6:08 AM  Between 7am to 6pm - Pager - [phone]  After 6pm go to [email or web address] - password [email or web address] [facility]  Office  [phone]  CC: Primary care physician; Pcp Not In System

## 2015-08-05 NOTE — ED Notes (Signed)
Pt reports having diarrhea yesterday with weakness, diaphoresis, nausea/vomiting. Pt reports having bloody beginning 7:30 yesterday evening, and 3 more instances occuring since then. Pt reports her stool is formed, but red. Pt reports 8 out of 10 pain only when defecating. Pt denies any current pain.

## 2015-08-05 NOTE — Consult Note (Signed)
Pharmacy Antibiotic Note  Taylor Morrow is a 70 y.o. female admitted on 08/05/2015 with ischemic colitis.  Pharmacy has been consulted for zosyn dosing.  Plan: Zosyn 3.375g IV q8h (4 hour infusion).  Height: 5\' 1"  (154.9 cm) Weight: 198 lb 8 oz (90.039 kg) IBW/kg (Calculated) : 47.8  Temp (24hrs), Avg:98 F (36.7 C), Min:97.8 F (36.6 C), Max:98.2 F (36.8 C)   Recent Labs Lab 08/05/15 0500  WBC 8.4  CREATININE 0.93    Estimated Creatinine Clearance: 56.7 mL/min (by C-G formula based on Cr of 0.93).    No Known Allergies  Antimicrobials this admission:   Dose adjustments this admission:   Microbiology results:   Thank you for allowing pharmacy to be a part of this patient's care.  Ramond Dial 08/05/2015 4:46 PM

## 2015-08-05 NOTE — Plan of Care (Signed)
Problem: Bowel/Gastric: Goal: Will show no signs and symptoms of gastrointestinal bleeding Outcome: Progressing 1xsmear of brown with red streaks stool during the shift.   Problem: Fluid Volume: Goal: Will show no signs and symptoms of excessive bleeding Outcome: Progressing VSS. Last Hgb 12.2

## 2015-08-05 NOTE — Progress Notes (Signed)
Pastoral Care and Prayer provided. °

## 2015-08-05 NOTE — ED Notes (Signed)
Pt informed stool sample needed. Pt reports she will inform nurse when she is able to have a bowel movement

## 2015-08-05 NOTE — ED Notes (Signed)
Pt reports her husband was diagnosed with CDif in the beginning of January, taking his last dose of antibiotics 2 weeks ago.

## 2015-08-05 NOTE — ED Provider Notes (Addendum)
[facility] Emergency Department Provider Note  ____________________________________________  Time seen: Approximately 4:50 AM  I have reviewed the triage vital signs and the nursing notes.   HISTORY  Chief Complaint Rectal Bleeding    HPI Taylor Morrow is a 70 y.o. female with no significant past medical history and no prior history of gastrointestinal bleeding who presents with multiple episodes of diarrhea for about 2 days with the recent onset of bright red blood per rectum over the last several episodes.  It is accompanied with mild to moderate generalized abdominal cramping that seems to be associated with the bowel movements.  Nothing makes it better and nothing makes it worse.  The number of stools have been numerous and initially they were simply loosened poorly formed but they have developed an increasing amount of red blood and then the last episode she had was a small amount but it seemed to be "pure blood".  She notes that she could notquite make it to the toilet and she dripped blood on the floor after point down her pants.  She has had some nausea and some vomiting yesterday but the vomiting has resolved although there is some persistent mild nausea.  She denies fever/chills, chest pain, shortness of breath.  Of note, her husband finished antibiotics about 2 weeks ago after he had a severe bout of C. difficile due to taking antibiotics for a respiratory infection.  He has tested negative and her symptoms only started 2 days ago.  She notes that she is due for a colonoscopy next year and that she has had polyps in the past.   History reviewed. No pertinent past medical history.  There are no active problems to display for this patient.   Past Surgical History  Procedure Laterality Date  . Thyroid surgery    . Back surgery    . Rotator cuff repair Right     Current Outpatient Rx  Name  Route  Sig  Dispense  Refill  . lisinopril  (PRINIVIL,ZESTRIL) 10 MG tablet   Oral   Take 10 mg by mouth daily.           Allergies Review of patient's allergies indicates no known allergies.  No family history on file.  Social History Social History  Substance Use Topics  . Smoking status: Never Smoker   . Smokeless tobacco: None  . Alcohol Use: No    Review of Systems Constitutional: No fever/chills Eyes: No visual changes. ENT: No sore throat. Cardiovascular: Denies chest pain. Respiratory: Denies shortness of breath. Gastrointestinal: Abdominal cramping, several episodes of vomiting but now just nausea, and numerous episodes of diarrhea with gross blood. Genitourinary: Negative for dysuria. Musculoskeletal: Negative for back pain. Skin: Negative for rash. Neurological: Negative for headaches, focal weakness or numbness.  10-point ROS otherwise negative.  ____________________________________________   PHYSICAL EXAM:  VITAL SIGNS: ED Triage Vitals  Enc Vitals Group     BP 08/05/15 0425 135/85 mmHg     Pulse Rate 08/05/15 0425 78     Resp 08/05/15 0425 20     Temp 08/05/15 0425 98 F (36.7 C)     Temp Source 08/05/15 0425 Oral     SpO2 08/05/15 0425 95 %     Weight 08/05/15 0425 195 lb (88.451 kg)     Height 08/05/15 0425 5\' 1"  (1.549 m)     Head Cir --      Peak Flow --      Pain Score 08/05/15 0425  6     Pain Loc --      Pain Edu? --      Excl. in [place]? --     Constitutional: Alert and oriented. Well appearing and in no acute distress. Eyes: Conjunctivae are normal. PERRL. EOMI. Head: Atraumatic. Nose: No congestion/rhinnorhea. Mouth/Throat: Mucous membranes are moist.  Oropharynx non-erythematous. Neck: No stridor.   Cardiovascular: Normal rate, regular rhythm. Grossly normal heart sounds.  Good peripheral circulation. Respiratory: Normal respiratory effort.  No retractions. Lungs CTAB. Gastrointestinal: Soft and nontender. No distention. No abdominal bruits. No CVA tenderness. Rectal:   Non-thrombosed, non-bleeding external hemorrhoid.  Non-tender exam.  Gross red blood on digital exam. Musculoskeletal: No lower extremity tenderness nor edema.  No joint effusions. Neurologic:  Normal speech and language. No gross focal neurologic deficits are appreciated.  Skin:  Skin is warm, dry and intact. No rash noted. Psychiatric: Mood and affect are normal. Speech and behavior are normal.  ____________________________________________   LABS (all labs ordered are listed, but only abnormal results are displayed)  Labs Reviewed  CBC WITH DIFFERENTIAL/PLATELET - Abnormal; Notable for the following:    Monocytes Absolute 1.0 (*)    All other components within normal limits  GASTROINTESTINAL PANEL BY PCR, STOOL (REPLACES STOOL CULTURE)  C DIFFICILE QUICK SCREEN W PCR REFLEX  COMPREHENSIVE METABOLIC PANEL  LIPASE, BLOOD  TYPE AND SCREEN   ____________________________________________  EKG  None ____________________________________________  RADIOLOGY   No results found.  ____________________________________________   PROCEDURES  Procedure(s) performed: None  Critical Care performed: No ____________________________________________   INITIAL IMPRESSION / ASSESSMENT AND PLAN / ED COURSE  Pertinent labs & imaging results that were available during my care of the patient were reviewed by me and considered in my medical decision making (see chart for details).  Given infectious diarrhea exposure, will check GI pathogen panel including C. Diff to eval for enterohemorrhagic bacterial infection and c diff.  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  Given the gross blood present on rectal exam, I will admit for further management.  H/H stable at this time.  ----------------------------------------- 6:12 AM on 08/05/2015 -----------------------------------------  Dr. Estanislado Pandy and I discussed it, and given the lack of patient's infectious symptoms and strong  probability this is bacterial (including C. Diff), we have canceled the precautions and stool studies.  She may go to a regular bed.  ____________________________________________  FINAL CLINICAL IMPRESSION(S) / ED DIAGNOSES  Final diagnoses:  Rectal bleeding  Abdominal cramping  Diarrhea, unspecified type      NEW MEDICATIONS STARTED DURING THIS VISIT:  New Prescriptions   No medications on file     Hinda Kehr, MD 08/05/15 [id number]  Hinda Kehr, MD 08/05/15 [phone]

## 2015-08-06 DIAGNOSIS — K625 Hemorrhage of anus and rectum: Secondary | ICD-10-CM | POA: Diagnosis not present

## 2015-08-06 LAB — CBC
HEMATOCRIT: 37.4 % (ref 35.0–47.0)
HEMOGLOBIN: 12.4 g/dL (ref 12.0–16.0)
MCH: 29.2 pg (ref 26.0–34.0)
MCHC: 33.1 g/dL (ref 32.0–36.0)
MCV: 88.3 fL (ref 80.0–100.0)
Platelets: 217 10*3/uL (ref 150–440)
RBC: 4.23 MIL/uL (ref 3.80–5.20)
RDW: 14.1 % (ref 11.5–14.5)
WBC: 6.3 10*3/uL (ref 3.6–11.0)

## 2015-08-06 LAB — BASIC METABOLIC PANEL
Anion gap: 7 (ref 5–15)
BUN: 11 mg/dL (ref 6–20)
CO2: 25 mmol/L (ref 22–32)
Calcium: 8.9 mg/dL (ref 8.9–10.3)
Chloride: 107 mmol/L (ref 101–111)
Creatinine, Ser: 0.94 mg/dL (ref 0.44–1.00)
GFR calc Af Amer: >60 mL/min (ref >60)
GFR, EST NON AFRICAN AMERICAN: 60 mL/min — AB (ref >60)
GLUCOSE: 134 mg/dL — AB (ref 65–99)
POTASSIUM: 3.7 mmol/L (ref 3.5–5.1)
Sodium: 139 mmol/L (ref 135–145)

## 2015-08-06 LAB — TYPE AND SCREEN
ABO/RH(D): A NEG
Antibody Screen: NEGATIVE

## 2015-08-06 NOTE — Consult Note (Signed)
Patient feeling much better with no signif tenderness.  No bleeding. I think she can go home tomorrow and follow up with me as out patient.  Probable ischemic colitis, may do colonoscopy in a few weeks.

## 2015-08-06 NOTE — Consult Note (Signed)
Pharmacy Antibiotic Note  CHERA FLADUNG is a 70 y.o. female admitted on 08/05/2015 with ischemic colitis.  Pharmacy has been consulted for zosyn dosing.  Plan: Zosyn 3.375g IV q8h (4 hour infusion).  Height: 5\' 1"  (154.9 cm) Weight: 198 lb 8 oz (90.039 kg) IBW/kg (Calculated) : 47.8  Temp (24hrs), Avg:98.3 F (36.8 C), Min:98.2 F (36.8 C), Max:98.3 F (36.8 C)   Recent Labs Lab 08/05/15 0500 08/06/15 0640  WBC 8.4 6.3  CREATININE 0.93 0.94    Estimated Creatinine Clearance: 56.1 mL/min (by C-G formula based on Cr of 0.94).    No Known Allergies  Antimicrobials this admission:   Dose adjustments this admission:   Microbiology results:   Thank you for allowing pharmacy to be a part of this patient's care.  Loden Laurent D 08/06/2015 8:41 AM

## 2015-08-06 NOTE — Progress Notes (Signed)
[place] [facility] [place] NAME: Taylor Morrow    MR#:  [id number]  DATE OF BIRTH:  [birth year]  SUBJECTIVE:   Pt. Here due to rectal bleeding.  No further bleeding overnight.  No abdominal pain, N/V.  Hg. Stable. CT yesterday showing ischemic colitis.   REVIEW OF SYSTEMS:    Review of Systems  Constitutional: Negative for fever and chills.  HENT: Negative for congestion and tinnitus.   Eyes: Negative for blurred vision and double vision.  Respiratory: Negative for cough, shortness of breath and wheezing.   Cardiovascular: Negative for chest pain, orthopnea and PND.  Gastrointestinal: Positive for blood in stool. Negative for nausea, vomiting, abdominal pain and diarrhea.  Genitourinary: Negative for dysuria and hematuria.  Neurological: Negative for dizziness, sensory change and focal weakness.  All other systems reviewed and are negative.   Nutrition: Clear liquids Tolerating Diet: Yes Tolerating PT:  Ambulatory  DRUG ALLERGIES:  No Known Allergies  VITALS:  Blood pressure 122/62, pulse 71, temperature 98.3 F (36.8 C), temperature source Oral, resp. rate 18, height 5\' 1"  (1.549 m), weight 90.039 kg (198 lb 8 oz), SpO2 97 %.  PHYSICAL EXAMINATION:   Physical Exam  GENERAL:  70 y.o.-year-old patient lying in the bed with no acute distress.  EYES: Pupils equal, round, reactive to light and accommodation. No scleral icterus. Extraocular muscles intact.  HEENT: Head atraumatic, normocephalic. Oropharynx and nasopharynx clear.  NECK:  Supple, no jugular venous distention. No thyroid enlargement, no tenderness.  LUNGS: Normal breath sounds bilaterally, no wheezing, rales, rhonchi. No use of accessory muscles of respiration.  CARDIOVASCULAR: S1, S2 normal. No murmurs, rubs, or gallops.  ABDOMEN: Soft, nontender, nondistended. Bowel sounds present. No organomegaly or mass.  EXTREMITIES: No cyanosis, clubbing or edema b/l.     NEUROLOGIC: Cranial nerves II through XII are intact. No focal Motor or sensory deficits b/l.   PSYCHIATRIC: The patient is alert and oriented x 3.  SKIN: No obvious rash, lesion, or ulcer.    LABORATORY PANEL:   CBC  Recent Labs Lab 08/06/15 0640  WBC 6.3  HGB 12.4  HCT 37.4  PLT 217   ------------------------------------------------------------------------------------------------------------------  Chemistries   Recent Labs Lab 08/05/15 0500 08/06/15 0640  NA 138 139  K 4.1 3.7  CL 105 107  CO2 24 25  GLUCOSE 103* 134*  BUN 21* 11  CREATININE 0.93 0.94  CALCIUM 9.4 8.9  AST 20  --   ALT 16  --   ALKPHOS 97  --   BILITOT 0.8  --    ------------------------------------------------------------------------------------------------------------------  Cardiac Enzymes No results for input(s): TROPONINI in the last 168 hours. ------------------------------------------------------------------------------------------------------------------  RADIOLOGY:  Ct Abdomen Pelvis W Contrast  08/05/2015  CLINICAL DATA:  Bleeding per rectum since yesterday. Nausea. abdominal pain. EXAM: CT ABDOMEN AND PELVIS WITH CONTRAST TECHNIQUE: Multidetector CT imaging of the abdomen and pelvis was performed using the standard protocol following bolus administration of intravenous contrast. CONTRAST:  130mL OMNIPAQUE IOHEXOL 300 MG/ML  SOLN COMPARISON:  None. FINDINGS: Lower chest: Scarring at both lung bases. Borderline cardiomegaly, without pericardial or pleural effusion. Surgical changes at the gastroesophageal junction. Hepatobiliary: Subcentimeter right hepatic lobe low-density lesion is well-circumscribed and likely a cyst. Normal gallbladder, without biliary ductal dilatation. Pancreas: Normal, without mass or ductal dilatation. Spleen: Normal in size, without focal abnormality. Adrenals/Urinary Tract: Normal left adrenal gland. Mild right adrenal nodularity. An upper pole left renal lesion is  too small to characterize. A lower  pole left renal lesion measures 3.4 cm and is consistent with a cyst. No hydronephrosis. Minimal air in the nondependent bladder. Stomach/Bowel: Proximal gastric underdistention. Scattered colonic diverticula. Wall thickening involves the splenic flexure and descending colon. This is moderate. Mild surrounding edema. Normal terminal ileum. Appendix not visualized, but no pericecal inflammation identified. Normal small bowel. Vascular/Lymphatic: Normal caliber of the aorta and branch vessels. Celiac and mesenteric vessels patent, without significant atherosclerosis. No venous thrombus identified. No abdominopelvic adenopathy. Reproductive: Normal uterus and adnexa. Other: No significant free fluid. No free intraperitoneal air. pelvic floor laxity. Musculoskeletal: Degenerative disc disease at the lumbosacral junction. S shaped thoracolumbar spine curvature. IMPRESSION: 1. Left-sided colitis. Clinical history and distribution favor ischemia. No evidence arterial thrombus or venous occlusion. 2. Air within the urinary bladder, likely iatrogenic. Correlate with instrumentation. Electronically Signed   By: Abigail Miyamoto M.D.   On: 08/05/2015 15:33     ASSESSMENT AND PLAN:   70 year old female with past medical history of hypertension presents to the hospital due to multiple episodes of rectal bleeding.  #1 GI bleed-this is a lower GI bleed given the patient's rectal bleeding. - CT head showing ischemic colitis but clinically pt. Is doing well.  - cont. IV Zosyn and tolerated clear liquid diet well and will advance to full liquid today.  - await further Vascular surgery input.   #2 hypertension- normotensive. Hold lisinopril for now.  If Hg. Stable and no abdominal pain or bleeding overnight then likely d/c home tomorrow.   All the records are reviewed and case discussed with Care Management/Social Workerr. Management plans discussed with the patient, family and they are  in agreement.  CODE STATUS: Full  DVT Prophylaxis: Teds and SCDs  TOTAL TIME TAKING CARE OF THIS PATIENT: 30 minutes.   POSSIBLE D/C IN 1-2 DAYS, DEPENDING ON CLINICAL CONDITION.  Greater than 50% of time spent in coordination of care and discussion with the patient, vascular surgery and gastroenterology.   Henreitta Leber M.D on 08/06/2015 at 1:12 PM  Between 7am to 6pm - Pager - [phone]  After 6pm go to [email or web address] - password [email or web address] [facility]  Office  [phone]  CC: Primary care physician; Pcp Not In System

## 2015-08-06 NOTE — Consult Note (Signed)
[facility] Vascular Consult Note  MRN : [id number]  Taylor Morrow is a 70 y.o. ([birth year]) female who presents with chief complaint of  Chief Complaint  Patient presents with  . Rectal Bleeding  .  History of Present Illness: Patient admitted yesterday with blood per rectum two nights ago.  Asked by Dr. Verdell Carmine of the hospitalist service to see the patient and comment on her vascular supply to the colon.  She denies any antecedent weight loss, food fear, or abdominal pain.  The discomfort at this time is crampy and achy and in the lower abdomen.  She does have urgency to defecate, but her output has been low.  No further bleeding here. She had a CT scan which I have independently reviewed.  She  Has left sided colitis and ischemic colitis is certainly in the differential diagnosis.  The Celiac, SMA, and IMA are widely patent and she has scant atherosclerotic disease present throughout the visualized portions of the abd/pelvis CT.  Current Facility-Administered Medications  Medication Dose Route Frequency Provider Last Rate Last Dose  . 0.9 %  sodium chloride infusion   Intravenous Continuous Saundra Shelling, MD 125 mL/hr at 08/05/15 2145    . acetaminophen (TYLENOL) tablet 650 mg  650 mg Oral Q6H PRN Saundra Shelling, MD       Or  . acetaminophen (TYLENOL) suppository 650 mg  650 mg Rectal Q6H PRN Pavan Pyreddy, MD      . morphine 2 MG/ML injection 2 mg  2 mg Intravenous Q4H PRN Pavan Pyreddy, MD      . ondansetron (ZOFRAN) tablet 4 mg  4 mg Oral Q6H PRN Saundra Shelling, MD       Or  . ondansetron (ZOFRAN) injection 4 mg  4 mg Intravenous Q6H PRN Pavan Pyreddy, MD      . pantoprazole (PROTONIX) injection 40 mg  40 mg Intravenous Q12H Pavan Pyreddy, MD   40 mg at 08/06/15 1024  . piperacillin-tazobactam (ZOSYN) IVPB 3.375 g  3.375 g Intravenous 3 times per day Ramond Dial, RPH   3.375 g at 08/06/15 1024    Past Medical History  Diagnosis Date  . Hypertension      Past Surgical History  Procedure Laterality Date  . Thyroid surgery    . Back surgery    . Rotator cuff repair Right     Social History Social History  Substance Use Topics  . Smoking status: Never Smoker   . Smokeless tobacco: None  . Alcohol Use: No  No IVDU  Family History Family History  Problem Relation Age of Onset  . Hypertension Mother   No bleeding or clotting disorders. No aneurysms. No autoimmune diseases  No Known Allergies   REVIEW OF SYSTEMS (Negative unless checked)  Constitutional: [] Weight loss  [] Fever  [] Chills Cardiac: [] Chest pain   [] Chest pressure   [] Palpitations   [] Shortness of breath when laying flat   [] Shortness of breath at rest   [] Shortness of breath with exertion. Vascular:  [] Pain in legs with walking   [] Pain in legs at rest   [] Pain in legs when laying flat   [] Claudication   [] Pain in feet when walking  [] Pain in feet at rest  [] Pain in feet when laying flat   [] History of DVT   [] Phlebitis   [] Swelling in legs   [] Varicose veins   [] Non-healing ulcers Pulmonary:   [] Uses home oxygen   [] Productive cough   [] Hemoptysis   [] Wheeze  []   COPD   [] Asthma Neurologic:  [] Dizziness  [] Blackouts   [] Seizures   [] History of stroke   [] History of TIA  [] Aphasia   [] Temporary blindness   [] Dysphagia   [] Weakness or numbness in arms   [] Weakness or numbness in legs Musculoskeletal:  [] Arthritis   [] Joint swelling   [] Joint pain   [] Low back pain Hematologic:  [] Easy bruising  [] Easy bleeding   [] Hypercoagulable state   [] Anemic  [] Hepatitis Gastrointestinal:  [x] Blood in stool   [] Vomiting blood  [] Gastroesophageal reflux/heartburn   [] Difficulty swallowing. Genitourinary:  [] Chronic kidney disease   [] Difficult urination  [] Frequent urination  [] Burning with urination   [] Blood in urine Skin:  [] Rashes   [] Ulcers   [] Wounds Psychological:  [] History of anxiety   []  History of major depression.  Physical Examination  Filed Vitals:   08/05/15 0648  08/05/15 1243 08/05/15 2055 08/06/15 0607  BP: 124/77 110/68 103/58 122/62  Pulse: 95 86 81 71  Temp: 97.8 F (36.6 C) 98.2 F (36.8 C) 98.3 F (36.8 C) 98.3 F (36.8 C)  TempSrc: Oral Oral Oral Oral  Resp: 18 18  18   Height: 5\' 1"  (1.549 m)     Weight: 90.039 kg (198 lb 8 oz)     SpO2: 94% 96% 96% 97%   Body mass index is 37.53 kg/(m^2). Gen:  WD/WN, NAD Head: [place]/AT, No temporalis wasting. Prominent temp pulse not noted. Ear/Nose/Throat: Hearing grossly intact, nares w/o erythema or drainage, oropharynx w/o Erythema/Exudate Eyes: PERRLA, EOMI.  Neck: Supple, no nuchal rigidity.  No bruit or JVD.  Pulmonary:  Good air movement, clear to auscultation bilaterally.  Cardiac: RRR, normal S1, S2, no Murmurs, rubs or gallops. Vascular:  Vessel Right Left  Radial Palpable Palpable  Ulnar Palpable Palpable  Brachial Palpable Palpable  Carotid Palpable, without bruit Palpable, without bruit  Aorta Not palpable N/A  Femoral Palpable Palpable  Popliteal Palpable Palpable  PT Palpable Palpable  DP Palpable Palpable   Gastrointestinal: soft, mild tenderness in lower abdomen. No guarding/reflex. No masses, surgical incisions, or scars. Musculoskeletal: M/S 5/5 throughout.  Extremities without ischemic changes.  No deformity or atrophy. No edema. Neurologic: CN 2-12 intact. Pain and light touch intact in extremities.  Symmetrical.  Speech is fluent. Motor exam as listed above. Psychiatric: Judgment intact, Mood & affect appropriate for pt's clinical situation. Dermatologic: No rashes or ulcers noted.  No cellulitis or open wounds. Lymph : No Cervical, Axillary, or Inguinal lymphadenopathy.    CBC Lab Results  Component Value Date   WBC 6.3 08/06/2015   HGB 12.4 08/06/2015   HCT 37.4 08/06/2015   MCV 88.3 08/06/2015   PLT 217 08/06/2015    BMET    Component Value Date/Time   NA 139 08/06/2015 0640   K 3.7 08/06/2015 0640   CL 107 08/06/2015 0640   CO2 25 08/06/2015 0640    GLUCOSE 134* 08/06/2015 0640   BUN 11 08/06/2015 0640   CREATININE 0.94 08/06/2015 0640   CALCIUM 8.9 08/06/2015 0640   GFRNONAA 60* 08/06/2015 0640   GFRAA >60 08/06/2015 0640   Estimated Creatinine Clearance: 56.1 mL/min (by C-G formula based on Cr of 0.94).  COAG No results found for: INR, PROTIME  Radiology Ct Abdomen Pelvis W Contrast  08/05/2015  CLINICAL DATA:  Bleeding per rectum since yesterday. Nausea. abdominal pain. EXAM: CT ABDOMEN AND PELVIS WITH CONTRAST TECHNIQUE: Multidetector CT imaging of the abdomen and pelvis was performed using the standard protocol following bolus administration of intravenous contrast. CONTRAST:  161mL OMNIPAQUE IOHEXOL 300 MG/ML  SOLN COMPARISON:  None. FINDINGS: Lower chest: Scarring at both lung bases. Borderline cardiomegaly, without pericardial or pleural effusion. Surgical changes at the gastroesophageal junction. Hepatobiliary: Subcentimeter right hepatic lobe low-density lesion is well-circumscribed and likely a cyst. Normal gallbladder, without biliary ductal dilatation. Pancreas: Normal, without mass or ductal dilatation. Spleen: Normal in size, without focal abnormality. Adrenals/Urinary Tract: Normal left adrenal gland. Mild right adrenal nodularity. An upper pole left renal lesion is too small to characterize. A lower pole left renal lesion measures 3.4 cm and is consistent with a cyst. No hydronephrosis. Minimal air in the nondependent bladder. Stomach/Bowel: Proximal gastric underdistention. Scattered colonic diverticula. Wall thickening involves the splenic flexure and descending colon. This is moderate. Mild surrounding edema. Normal terminal ileum. Appendix not visualized, but no pericecal inflammation identified. Normal small bowel. Vascular/Lymphatic: Normal caliber of the aorta and branch vessels. Celiac and mesenteric vessels patent, without significant atherosclerosis. No venous thrombus identified. No abdominopelvic adenopathy.  Reproductive: Normal uterus and adnexa. Other: No significant free fluid. No free intraperitoneal air. pelvic floor laxity. Musculoskeletal: Degenerative disc disease at the lumbosacral junction. S shaped thoracolumbar spine curvature. IMPRESSION: 1. Left-sided colitis. Clinical history and distribution favor ischemia. No evidence arterial thrombus or venous occlusion. 2. Air within the urinary bladder, likely iatrogenic. Correlate with instrumentation. Electronically Signed   By: Abigail Miyamoto M.D.   On: 08/05/2015 15:33      Assessment/Plan 1. Left sided colitis. Possibly ischemic.  If it is ischemic, it is small vessel disease based off of her CT scan which I have reviewed which shows widely patent celiac, SMA, and IMA.  No further vascular work up necessary at this time.   2. HTN. Stable. On outpatient meds.   Essex Perry, MD  08/06/2015 1:26 PM

## 2015-08-06 NOTE — Care Management Obs Status (Signed)
[facility] NOTIFICATION   Patient Details  Name: Taylor Morrow MRN: [id number] Date of Birth: Jul 21, 1945   Medicare Observation Status Notification Given:  Yes    Shelbie Ammons, RN 08/06/2015, 9:52 AM

## 2015-08-07 DIAGNOSIS — K625 Hemorrhage of anus and rectum: Secondary | ICD-10-CM | POA: Diagnosis not present

## 2015-08-07 LAB — HEMOGLOBIN: Hemoglobin: 11 g/dL — ABNORMAL LOW (ref 12.0–16.0)

## 2015-08-07 MED ORDER — AMOXICILLIN-POT CLAVULANATE 875-125 MG PO TABS
1.0000 | ORAL_TABLET | Freq: Two times a day (BID) | ORAL | Status: DC
  Administered 2015-08-07: 1 via ORAL
  Filled 2015-08-07: qty 1

## 2015-08-07 MED ORDER — AMOXICILLIN-POT CLAVULANATE 875-125 MG PO TABS: 1.0000 | ORAL_TABLET | Freq: Two times a day (BID) | ORAL | Status: DC

## 2015-08-07 NOTE — Discharge Summary (Signed)
[facility] NAME: Taylor Morrow    MR#:  [id number]  DATE OF BIRTH:  Apr 07, 1946  DATE OF ADMISSION:  08/05/2015 ADMITTING PHYSICIAN: Saundra Shelling, MD  DATE OF DISCHARGE: 08/07/2015  9:52 AM  PRIMARY CARE PHYSICIAN: Pcp Not In System    ADMISSION DIAGNOSIS:  Rectal bleeding [K62.5] Abdominal cramping [R10.9] Diarrhea, unspecified type [R19.7]  DISCHARGE DIAGNOSIS:  Principal Problem:   GI bleed Active Problems:   Rectal bleeding   SECONDARY DIAGNOSIS:   Past Medical History  Diagnosis Date  . Hypertension     HOSPITAL COURSE:   1. Acute GI bleeding and colitis. 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 Patient was seen in consultation by GI and vascular surgery. No further workup from a vascular standpoint. Finish up course of antibiotic with Augmentin. Patient feeling fine without abdominal pain or diarrhea in hospital. Advance diet. Hemoglobin stable. 2. Essential hypertension- can go back on lisinopril as outpatient.  DISCHARGE CONDITIONS:   Satisfactory  CONSULTS OBTAINED:  Treatment Team:  Manya Silvas, MD Algernon Huxley, MD  DRUG ALLERGIES:  No Known Allergies  DISCHARGE MEDICATIONS:   Discharge Medication List as of 08/07/2015  9:08 AM    START taking these medications   Details  amoxicillin-clavulanate (AUGMENTIN) 875-125 MG tablet Take 1 tablet by mouth every 12 (twelve) hours., Starting 08/07/2015, Until Discontinued, Print      CONTINUE these medications which have NOT CHANGED   Details  lisinopril (PRINIVIL,ZESTRIL) 10 MG tablet Take 10 mg by mouth daily., Until Discontinued, Historical Med         DISCHARGE INSTRUCTIONS:   Follow-up with PMD as outpatient  If you experience worsening of your admission symptoms, develop shortness of breath, life threatening emergency, suicidal or homicidal thoughts you must seek medical attention immediately by calling 911 or calling your MD immediately   if symptoms less severe.  You Must read complete instructions/literature along with all the possible adverse reactions/side effects for all the Medicines you take and that have been prescribed to you. Take any new Medicines after you have completely understood and accept all the possible adverse reactions/side effects.   Please note  You were cared for by a hospitalist during your hospital stay. If you have any questions about your discharge medications or the care you received while you were in the hospital after you are discharged, you can call the unit and asked to speak with the hospitalist on call if the hospitalist that took care of you is not available. Once you are discharged, your primary care physician will handle any further medical issues. Please note that NO REFILLS for any discharge medications will be authorized once you are discharged, as it is imperative that you return to your primary care physician (or establish a relationship with a primary care physician if you do not have one) for your aftercare needs so that they can reassess your need for medications and monitor your lab values.    Today   CHIEF COMPLAINT:   Chief Complaint  Patient presents with  . Rectal Bleeding    HISTORY OF PRESENT ILLNESS:  Taylor Morrow  is a 70 y.o. female with a known history of came in with rectal bleeding   VITAL SIGNS:  Blood pressure 98/59, pulse 80, temperature 97.8 F (36.6 C), temperature source Oral, resp. rate 18, height 5\' 1"  (1.549 m), weight 90.039 kg (198 lb 8 oz), SpO2 97 %.    PHYSICAL EXAMINATION:  GENERAL:  70 y.o.-year-old patient lying in the bed with no acute distress.  EYES: Pupils equal, round, reactive to light and accommodation. No scleral icterus. Extraocular muscles intact.  HEENT: Head atraumatic, normocephalic. Oropharynx and nasopharynx clear.  NECK:  Supple, no jugular venous distention. No thyroid enlargement, no tenderness.  LUNGS: Normal breath sounds  bilaterally, no wheezing, rales,rhonchi or crepitation. No use of accessory muscles of respiration.  CARDIOVASCULAR: S1, S2 normal. No murmurs, rubs, or gallops.  ABDOMEN: Soft, non-tender, non-distended. Bowel sounds present. No organomegaly or mass.  EXTREMITIES: No pedal edema, cyanosis, or clubbing.  NEUROLOGIC: Cranial nerves II through XII are intact. Muscle strength 5/5 in all extremities. Sensation intact. Gait not checked.  PSYCHIATRIC: The patient is alert and oriented x 3.  SKIN: No obvious rash, lesion, or ulcer.   DATA REVIEW:   CBC  Recent Labs Lab 08/06/15 0640 08/07/15 0439  WBC 6.3  --   HGB 12.4 11.0*  HCT 37.4  --   PLT 217  --     Chemistries   Recent Labs Lab 08/05/15 0500 08/06/15 0640  NA 138 139  K 4.1 3.7  CL 105 107  CO2 24 25  GLUCOSE 103* 134*  BUN 21* 11  CREATININE 0.93 0.94  CALCIUM 9.4 8.9  AST 20  --   ALT 16  --   ALKPHOS 97  --   BILITOT 0.8  --     Management plans discussed with the patient, family and they are in agreement.  CODE STATUS:  Code Status History    Date Active Date Inactive Code Status Order ID Comments User Context   08/05/2015  7:24 AM 08/07/2015 12:52 PM Full Code [id number]  Saundra Shelling, MD Inpatient      TOTAL TIME TAKING CARE OF THIS PATIENT: 35 minutes.    Loletha Grayer M.D on 08/07/2015 at 5:28 PM  Between 7am to 6pm - Pager - [phone]  After 6pm go to [email or web address] - password [email or web address] [facility]  Office  [phone]  CC: Primary care physician; Pcp Not In System

## 2015-08-07 NOTE — Progress Notes (Signed)
Pt being discharged today, pt belongings returned to pt, Iv removed, pt verified understanding of discharge instructions. She was rolled out in wheelchair by staff.

## 2015-09-20 ENCOUNTER — Other Ambulatory Visit: Payer: Self-pay | Admitting: Nurse Practitioner

## 2015-09-20 DIAGNOSIS — R9341 Abnormal radiologic findings on diagnostic imaging of renal pelvis, ureter, or bladder: Secondary | ICD-10-CM

## 2015-09-23 ENCOUNTER — Ambulatory Visit
Admission: RE | Admit: 2015-09-23 | Discharge: 2015-09-23 | Disposition: A | Discharge status: Home / Self Care | Payer: Medicare Other | Source: Ambulatory Visit | Attending: Nurse Practitioner | Admitting: Nurse Practitioner

## 2015-09-23 DIAGNOSIS — R9341 Abnormal radiologic findings on diagnostic imaging of renal pelvis, ureter, or bladder: Secondary | ICD-10-CM | POA: Insufficient documentation

## 2015-10-26 ENCOUNTER — Encounter: Payer: Self-pay | Admitting: *Deleted

## 2015-10-27 ENCOUNTER — Encounter: Payer: Self-pay | Admitting: Anesthesiology

## 2015-10-27 ENCOUNTER — Ambulatory Visit: Payer: Medicare Other | Admitting: Certified Registered Nurse Anesthetist

## 2015-10-27 ENCOUNTER — Ambulatory Visit
Admission: RE | Admit: 2015-10-27 | Discharge: 2015-10-27 | Disposition: A | Discharge status: Home / Self Care | Payer: Medicare Other | Source: Ambulatory Visit | Attending: Unknown Physician Specialty | Admitting: Unknown Physician Specialty

## 2015-10-27 ENCOUNTER — Encounter
Admission: RE | Disposition: A | Discharge status: Home / Self Care | Payer: Self-pay | Source: Ambulatory Visit | Attending: Unknown Physician Specialty

## 2015-10-27 DIAGNOSIS — I10 Essential (primary) hypertension: Secondary | ICD-10-CM | POA: Insufficient documentation

## 2015-10-27 DIAGNOSIS — K5730 Diverticulosis of large intestine without perforation or abscess without bleeding: Secondary | ICD-10-CM | POA: Diagnosis not present

## 2015-10-27 DIAGNOSIS — Z79899 Other long term (current) drug therapy: Secondary | ICD-10-CM | POA: Diagnosis not present

## 2015-10-27 DIAGNOSIS — D123 Benign neoplasm of transverse colon: Secondary | ICD-10-CM | POA: Insufficient documentation

## 2015-10-27 DIAGNOSIS — Z6837 Body mass index (BMI) 37.0-37.9, adult: Secondary | ICD-10-CM | POA: Diagnosis not present

## 2015-10-27 DIAGNOSIS — Z86010 Personal history of colonic polyps: Secondary | ICD-10-CM | POA: Insufficient documentation

## 2015-10-27 DIAGNOSIS — Z8249 Family history of ischemic heart disease and other diseases of the circulatory system: Secondary | ICD-10-CM | POA: Insufficient documentation

## 2015-10-27 DIAGNOSIS — K625 Hemorrhage of anus and rectum: Secondary | ICD-10-CM | POA: Insufficient documentation

## 2015-10-27 DIAGNOSIS — E559 Vitamin D deficiency, unspecified: Secondary | ICD-10-CM | POA: Diagnosis not present

## 2015-10-27 DIAGNOSIS — K640 First degree hemorrhoids: Secondary | ICD-10-CM | POA: Insufficient documentation

## 2015-10-27 DIAGNOSIS — D125 Benign neoplasm of sigmoid colon: Secondary | ICD-10-CM | POA: Insufficient documentation

## 2015-10-27 HISTORY — PX: COLONOSCOPY WITH PROPOFOL: SHX5780

## 2015-10-27 HISTORY — DX: Vitamin D deficiency, unspecified: E55.9

## 2015-10-27 SURGERY — COLONOSCOPY WITH PROPOFOL
Anesthesia: General

## 2015-10-27 MED ORDER — PROPOFOL 10 MG/ML IV BOLUS
INTRAVENOUS | Status: DC | PRN
  Administered 2015-10-27: 70 mg via INTRAVENOUS

## 2015-10-27 MED ORDER — LIDOCAINE HCL (CARDIAC) 20 MG/ML IV SOLN
INTRAVENOUS | Status: DC | PRN
  Administered 2015-10-27: 60 mg via INTRAVENOUS

## 2015-10-27 MED ORDER — SODIUM CHLORIDE 0.9 % IV SOLN: INTRAVENOUS | Status: DC

## 2015-10-27 MED ORDER — PROPOFOL 500 MG/50ML IV EMUL
INTRAVENOUS | Status: DC | PRN
  Administered 2015-10-27: 140 ug/kg/min via INTRAVENOUS

## 2015-10-27 MED ORDER — SODIUM CHLORIDE 0.9 % IV SOLN
INTRAVENOUS | Status: DC
  Administered 2015-10-27: 1000 mL via INTRAVENOUS

## 2015-10-27 NOTE — Op Note (Signed)
[facility] Gastroenterology Patient Name: Taylor Morrow Procedure Date: 10/27/2015 8:38 AM MRN: [id number] Account #: [id number] Date of Birth: Apr 07, 1946 Admit Type: Outpatient Age: 70 Room: [facility] ENDO ROOM 4 Gender: Female Note Status: Finalized Procedure:            Colonoscopy Indications:          Rectal bleeding, Abnormal CT of the GI tract Providers:            Manya Silvas, MD Referring MD:         No Local Md, MD (Referring MD) Medicines:            Propofol per Anesthesia Complications:        No immediate complications. Procedure:            Pre-Anesthesia Assessment:                       - After reviewing the risks and benefits, the patient                        was deemed in satisfactory condition to undergo the                        procedure.                       After obtaining informed consent, the colonoscope was                        passed under direct vision. Throughout the procedure,                        the patient's blood pressure, pulse, and oxygen                        saturations were monitored continuously. The                        Colonoscope was introduced through the anus and                        advanced to the the cecum, identified by appendiceal                        orifice and ileocecal valve. The colonoscopy was                        performed without difficulty. The patient tolerated the                        procedure well. The quality of the bowel preparation                        was excellent. Findings:      Three sessile polyps were found in the sigmoid colon and splenic       flexure. The polyps were diminutive in size. These polyps were removed       with a jumbo cold forceps. Resection and retrieval were complete.      A few small-mouthed diverticula were found in the sigmoid colon.      Internal hemorrhoids were found during endoscopy. The hemorrhoids were  small and Grade I (internal  hemorrhoids that do not prolapse). Impression:           - Three diminutive polyps in the sigmoid colon and at                        the splenic flexure, removed with a jumbo cold forceps.                        Resected and retrieved.                       - Diverticulosis in the sigmoid colon.                       - Internal hemorrhoids. Recommendation:       - Await pathology results. Manya Silvas, MD 10/27/2015 9:05:45 AM This report has been signed electronically. Number of Addenda: 0 Note Initiated On: 10/27/2015 8:38 AM Scope Withdrawal Time: 0 hours 13 minutes 0 seconds  Total Procedure Duration: 0 hours 20 minutes 52 seconds       [facility]

## 2015-10-27 NOTE — H&P (Signed)
   Primary Care Physician:  Pcp Not In System Primary Gastroenterologist:  Dr. Vira Agar  Pre-Procedure History & Physical: HPI:  Taylor Morrow is a 70 y.o. female is here for an colonoscopy.   Past Medical History  Diagnosis Date  . Hypertension   . Vitamin D deficiency     Past Surgical History  Procedure Laterality Date  . Thyroid surgery    . Back surgery    . Rotator cuff repair Right     Prior to Admission medications   Medication Sig Start Date End Date Taking? Authorizing Provider  calcium carbonate (OS-CAL - DOSED IN MG OF ELEMENTAL CALCIUM) 1250 (500 Ca) MG tablet Take 1 tablet by mouth 2 (two) times daily with a meal.   Yes Historical Provider, MD  ergocalciferol (VITAMIN D2) 50000 units capsule Take 50,000 Units by mouth once a week.   Yes Historical Provider, MD  OMEGA 3-6-9 FATTY ACIDS PO Take 1,000 mg by mouth daily.   Yes Historical Provider, MD  amoxicillin-clavulanate (AUGMENTIN) 875-125 MG tablet Take 1 tablet by mouth every 12 (twelve) hours. 08/07/15   Loletha Grayer, MD  lisinopril (PRINIVIL,ZESTRIL) 10 MG tablet Take 10 mg by mouth daily.    Historical Provider, MD    Allergies as of 10/19/2015  . (No Known Allergies)    Family History  Problem Relation Age of Onset  . Hypertension Mother     Social History   Social History  . Marital Status: Married    Spouse Name: N/A  . Number of Children: N/A  . Years of Education: N/A   Occupational History  . Not on file.   Social History Main Topics  . Smoking status: Never Smoker   . Smokeless tobacco: Not on file  . Alcohol Use: No  . Drug Use: No  . Sexual Activity: Not on file   Other Topics Concern  . Not on file   Social History Narrative   Retired, lives with her husband at home.    Review of Systems: See HPI, otherwise negative ROS  Physical Exam: BP 128/81 mmHg  Pulse 90  Temp(Src) 96.3 F (35.7 C) (Tympanic)  Resp 14  Ht 5\' 1"  (1.549 m)  Wt 89.812 kg (198 lb)  BMI 37.43  kg/m2  SpO2 99% General:   Alert,  pleasant and cooperative in NAD Head:  Normocephalic and atraumatic. Neck:  Supple; no masses or thyromegaly. Lungs:  Clear throughout to auscultation.    Heart:  Regular rate and rhythm. Abdomen:  Soft, nontender and nondistended. Normal bowel sounds, without guarding, and without rebound.   Neurologic:  Alert and  oriented x4;  grossly normal neurologically.  Impression/Plan: Taylor Morrow is here for an colonoscopy to be performed for rectal bleeding, recent colitis on CT scan.  Risks, benefits, limitations, and alternatives regarding  colonoscopy have been reviewed with the patient.  Questions have been answered.  All parties agreeable.   Gaylyn Cheers, MD  10/27/2015, 8:36 AM

## 2015-10-27 NOTE — Anesthesia Preprocedure Evaluation (Signed)
Anesthesia Evaluation  Patient identified by MRN, date of birth, ID band Patient awake    Reviewed: Allergy & Precautions, NPO status , Patient's Chart, lab work & pertinent test results, reviewed documented beta blocker date and time   Airway Mallampati: III  TM Distance: >3 FB     Dental  (+) Chipped   Pulmonary           Cardiovascular hypertension, Pt. on medications      Neuro/Psych    GI/Hepatic   Endo/Other  Morbid obesity  Renal/GU      Musculoskeletal   Abdominal   Peds  Hematology   Anesthesia Other Findings   Reproductive/Obstetrics                             Anesthesia Physical Anesthesia Plan  ASA: III  Anesthesia Plan: General   Post-op Pain Management:    Induction: Intravenous  Airway Management Planned: Nasal Cannula  Additional Equipment:   Intra-op Plan:   Post-operative Plan:   Informed Consent: I have reviewed the patients History and Physical, chart, labs and discussed the procedure including the risks, benefits and alternatives for the proposed anesthesia with the patient or authorized representative who has indicated his/her understanding and acceptance.     Plan Discussed with: CRNA  Anesthesia Plan Comments:         Anesthesia Quick Evaluation

## 2015-10-27 NOTE — Anesthesia Procedure Notes (Signed)
Performed by: Shanora Christensen Pre-anesthesia Checklist: Patient identified, Emergency Drugs available, Suction available, Patient being monitored and Timeout performed Patient Re-evaluated:Patient Re-evaluated prior to inductionOxygen Delivery Method: Nasal cannula Intubation Type: IV induction       

## 2015-10-27 NOTE — Anesthesia Postprocedure Evaluation (Signed)
Anesthesia Post Note  Patient: Taylor Morrow  Procedure(s) Performed: Procedure(s) (LRB): COLONOSCOPY WITH PROPOFOL (N/A)  Patient location during evaluation: Endoscopy Anesthesia Type: General Level of consciousness: awake and alert Pain management: pain level controlled Vital Signs Assessment: post-procedure vital signs reviewed and stable Respiratory status: spontaneous breathing, nonlabored ventilation, respiratory function stable and patient connected to nasal cannula oxygen Cardiovascular status: blood pressure returned to baseline and stable Postop Assessment: no signs of nausea or vomiting Anesthetic complications: no    Last Vitals:  Filed Vitals:   10/27/15 0926 10/27/15 0936  BP: 117/79 118/78  Pulse: 75 71  Temp:    Resp: 16 22    Last Pain: There were no vitals filed for this visit.               Kathleen Tamm S

## 2015-10-27 NOTE — Transfer of Care (Signed)
Immediate Anesthesia Transfer of Care Note  Patient: Taylor Morrow  Procedure(s) Performed: Procedure(s): COLONOSCOPY WITH PROPOFOL (N/A)  Patient Location: PACU  Anesthesia Type:General  Level of Consciousness: sedated  Airway & Oxygen Therapy: Patient Spontanous Breathing and Patient connected to nasal cannula oxygen  Post-op Assessment: Report given to RN and Post -op Vital signs reviewed and stable  Post vital signs: Reviewed and stable  Last Vitals:  Filed Vitals:   10/27/15 0755  BP: 128/81  Pulse: 90  Temp: 35.7 C  Resp: 14    Last Pain: There were no vitals filed for this visit.       Complications: No apparent anesthesia complications

## 2015-10-28 ENCOUNTER — Encounter: Payer: Self-pay | Admitting: Unknown Physician Specialty

## 2015-10-28 LAB — SURGICAL PATHOLOGY

## 2016-09-14 ENCOUNTER — Other Ambulatory Visit: Payer: Self-pay | Admitting: *Deleted

## 2016-09-14 DIAGNOSIS — N390 Urinary tract infection, site not specified: Secondary | ICD-10-CM

## 2016-09-15 ENCOUNTER — Other Ambulatory Visit
Admission: RE | Admit: 2016-09-15 | Discharge: 2016-09-15 | Disposition: A | Discharge status: Home / Self Care | Payer: Medicare Other | Source: Ambulatory Visit | Attending: Urology | Admitting: Urology

## 2016-09-15 ENCOUNTER — Ambulatory Visit (INDEPENDENT_AMBULATORY_CARE_PROVIDER_SITE_OTHER): Payer: Medicare Other | Admitting: Urology

## 2016-09-15 ENCOUNTER — Encounter: Payer: Self-pay | Admitting: Urology

## 2016-09-15 VITALS — BP 137/80 | HR 82 | Ht 61.0 in | Wt 192.0 lb

## 2016-09-15 DIAGNOSIS — N329 Bladder disorder, unspecified: Secondary | ICD-10-CM

## 2016-09-15 DIAGNOSIS — N8111 Cystocele, midline: Secondary | ICD-10-CM

## 2016-09-15 DIAGNOSIS — N952 Postmenopausal atrophic vaginitis: Secondary | ICD-10-CM

## 2016-09-15 DIAGNOSIS — N390 Urinary tract infection, site not specified: Secondary | ICD-10-CM

## 2016-09-15 DIAGNOSIS — N3946 Mixed incontinence: Secondary | ICD-10-CM

## 2016-09-15 LAB — URINALYSIS, COMPLETE (UACMP) WITH MICROSCOPIC
Bilirubin Urine: NEGATIVE
GLUCOSE, UA: NEGATIVE mg/dL
Hgb urine dipstick: NEGATIVE
KETONES UR: NEGATIVE mg/dL
LEUKOCYTES UA: NEGATIVE
NITRITE: NEGATIVE
PH: 6.5 (ref 5.0–8.0)
Protein, ur: NEGATIVE mg/dL
RBC / HPF: NONE SEEN RBC/hpf (ref 0–5)
Specific Gravity, Urine: 1.01 (ref 1.005–1.030)

## 2016-09-15 LAB — BLADDER SCAN AMB NON-IMAGING: Scan Result: 219

## 2016-09-15 MED ORDER — ESTRADIOL 0.1 MG/GM VA CREA: TOPICAL_CREAM | VAGINAL | 12 refills | Status: DC

## 2016-09-15 MED ORDER — ESTROGENS, CONJUGATED 0.625 MG/GM VA CREA: 1.0000 | TOPICAL_CREAM | Freq: Every day | VAGINAL | 12 refills | Status: DC

## 2016-09-15 NOTE — Progress Notes (Signed)
In and Out Catheterization  Patient is present today for a I & O catheterization due to recurrent uti. Patient was cleaned and prepped in a sterile fashion with betadine and Lidocaine 2% jelly was instilled into the urethra.  A 14FR cath was inserted no complications , 722VJ of urine return was noted, urine was yellow in color. A clean urine sample was collected for urinalysis. Bladder was drained and catheter was removed with out difficulty.    Preformed by: Lyndee Hensen CMA

## 2016-09-15 NOTE — Progress Notes (Addendum)
09/15/2016 10:26 AM   Taylor Morrow Oct 18, 1945 [id number]  Referring provider: [name], NP [address], [place] [address]  Chief Complaint  Patient presents with  . New Patient (Initial Visit)    recurrent uti referred by [name]    HPI: Patient is a 71 -year-old Caucasian female who is referred to [name] by, [name], NP, for recurrent urinary tract infections.  Patient states that she has had 7 urinary tract infections over the last year.  Her symptoms with a urinary tract infection consist of malodorous urine, lower back pain, cloudy urine and dysuria.  She denies gross hematuria, suprapubic pain, abdominal pain or flank pain.  She has not had any recent fevers, chills, nausea or vomiting.     She does not have a history of nephrolithiasis, GU surgery or GU trauma.    Reviewing her records,  she has had 5 documented positive urine cultures .  + KLUYVERU SP   on 10/23/2015  + E. Coli on 04/03/2016    + E. Coli on 06/08/2016  + E. Coli on 07/28/2016  + Staph epidermidis on 08/15/2016  She is not sexually active.   She is post menopausal.  She admits to constipation.  She does engage in good perineal hygiene. She does not take tub baths.   She does have incontinence.  She is using incontinence pads.  She is going through 5 thick Poise pads daily.  She has moderate volume of urine loss, but she has not soaked through her pads.    She is having not having pain with bladder filling.    Contrast CT of abdomen and pelvis was performed on 08/05/2015 for rectal bleeding. Findings were for  left-sided colitis. Clinical history and distribution favor ischemia. No evidence arterial thrombus or venous occlusion.  Air within the urinary bladder, likely iatrogenic. Correlate with instrumentation.  I have independently reviewed the films.  She has not had pneumaturia or passage of stool through her urine. She also has not had abdominal surgery,  inflammatory bowel disease or diverticulitis.  She is drinking 2 glasses of water daily.   2 cups of coffee.  2 cups of orange juice.    Her PVR today is 219 mL.     PMH: Past Medical History:  Diagnosis Date  . Hypertension   . Vitamin D deficiency     Surgical History: Past Surgical History:  Procedure Laterality Date  . BACK SURGERY    . COLONOSCOPY WITH PROPOFOL N/A 10/27/2015   Procedure: COLONOSCOPY WITH PROPOFOL;  Surgeon: Manya Silvas, MD;  Location: [facility]COPY;  Service: Endoscopy;  Laterality: N/A;  . FOOT SURGERY Bilateral    right 2013  left  2014  . HERNIA REPAIR     2007  . ROTATOR CUFF REPAIR Right   . THYROID SURGERY      Home Medications:  Allergies as of 09/15/2016   No Known Allergies     Medication List       Accurate as of 09/15/16 10:26 AM. Always use your most recent med list.          alendronate 70 MG tablet Commonly known as:  FOSAMAX Take 70 mg by mouth once a week. Take with a full glass of water on an empty stomach.   amoxicillin-clavulanate 875-125 MG tablet Commonly known as:  AUGMENTIN Take 1 tablet by mouth every 12 (twelve) hours.   calcium carbonate 1250 (500 Ca) MG tablet Commonly known  as:  OS-CAL - dosed in mg of elemental calcium Take 1 tablet by mouth 2 (two) times daily with a meal.   conjugated estrogens vaginal cream Commonly known as:  PREMARIN Place 1 Applicatorful vaginally daily. Apply 0.71m (pea-sized amount)  just inside the vaginal introitus with a finger-tip every night for two weeks and then Monday, Wednesday and Friday nights.   ergocalciferol 50000 units capsule Commonly known as:  VITAMIN D2 Take 50,000 Units by mouth once a week.   estradiol 0.1 MG/GM vaginal cream Commonly known as:  ESTRACE VAGINAL Apply 0.549m(pea-sized amount)  just inside the vaginal introitus with a finger-tip every night for two weeks and then Monday, Wednesday and Friday nights.   lisinopril 10 MG tablet Commonly  known as:  PRINIVIL,ZESTRIL Take 10 mg by mouth daily.   magnesium oxide 400 MG tablet Commonly known as:  MAG-OX Take 400 mg by mouth daily.   OMEGA 3-6-9 FATTY ACIDS PO Take 1,000 mg by mouth daily.       Allergies: No Known Allergies  Family History: Family History  Problem Relation Age of Onset  . Hypertension Mother   . Prostate cancer Neg Hx   . Kidney cancer Neg Hx   . Bladder Cancer Neg Hx     Social History:  reports that she has never smoked. She has never used smokeless tobacco. She reports that she does not drink alcohol or use drugs.  ROS: UROLOGY Frequent Urination?: No Hard to postpone urination?: No Burning/pain with urination?: No Get up at night to urinate?: No Leakage of urine?: No Urine stream starts and stops?: No Trouble starting stream?: No Do you have to strain to urinate?: No Blood in urine?: No Urinary tract infection?: Yes Sexually transmitted disease?: No Injury to kidneys or bladder?: No Painful intercourse?: No Weak stream?: No Currently pregnant?: No Vaginal bleeding?: No Last menstrual period?: n  Gastrointestinal Nausea?: No Vomiting?: No Indigestion/heartburn?: No Diarrhea?: No Constipation?: No  Constitutional Fever: No Night sweats?: No Weight loss?: No Fatigue?: No  Skin Skin rash/lesions?: No Itching?: No  Eyes Blurred vision?: No Double vision?: No  Ears/Nose/Throat Sore throat?: No Sinus problems?: No  Hematologic/Lymphatic Swollen glands?: No Easy bruising?: No  Cardiovascular Leg swelling?: No Chest pain?: No  Respiratory Cough?: No Shortness of breath?: No  Endocrine Excessive thirst?: No  Musculoskeletal Back pain?: No Joint pain?: No  Neurological Headaches?: No Dizziness?: No  Psychologic Depression?: No Anxiety?: No  Physical Exam: BP 137/80   Pulse 82   Ht 5' 1"  (1.549 m)   Wt 192 lb (87.1 kg)   BMI 36.28 kg/m   Constitutional: Well nourished. Alert and oriented,  No acute distress. HEENT: [place] AT, moist mucus membranes. Trachea midline, no masses. Cardiovascular: No clubbing, cyanosis, or edema. Respiratory: Normal respiratory effort, no increased work of breathing. GI: Abdomen is soft, non tender, non distended, no abdominal masses. Liver and spleen not palpable.  No hernias appreciated.  Stool sample for occult testing is not indicated.   GU: No CVA tenderness.  No bladder fullness or masses.  Atrophic external genitalia, normal pubic hair distribution, no lesions.  Normal urethral meatus, no lesions, no prolapse, no discharge.   No urethral masses, tenderness and/or tenderness. No bladder fullness, tenderness or masses. Pale vagina mucosa, poor estrogen effect, no discharge, no lesions, good pelvic support, Grade II cystocele is noted No rectocele noted.  Urine leakage with Valsalva.  No cervical motion tenderness.  Uterus is freely mobile and non-fixed.  No  adnexal/parametria masses or tenderness noted.  Anus and perineum are without rashes or lesions.    Skin: No rashes, bruises or suspicious lesions. Lymph: No cervical or inguinal adenopathy. Neurologic: Grossly intact, no focal deficits, moving all 4 extremities. Psychiatric: Normal mood and affect.  Laboratory Data: Lab Results  Component Value Date   WBC 6.3 08/06/2015   HGB 11.0 (L) 08/07/2015   HCT 37.4 08/06/2015   MCV 88.3 08/06/2015   PLT 217 08/06/2015    Lab Results  Component Value Date   CREATININE 0.94 08/06/2015    Lab Results  Component Value Date   AST 20 08/05/2015   Lab Results  Component Value Date   ALT 16 08/05/2015   Urinalysis Many bacteria.  See EPIC.    Urine cultures + KLUYVERU SP   on 10/23/2015  + E. Coli on 04/03/2016    + E. Coli on 06/08/2016  + E. Coli on 07/28/2016  + Staph epidermidis on 08/15/2016  Pertinent Imaging: CLINICAL DATA:  Bleeding per rectum since yesterday. Nausea. abdominal pain.  EXAM: CT ABDOMEN AND PELVIS WITH  CONTRAST  TECHNIQUE: Multidetector CT imaging of the abdomen and pelvis was performed using the standard protocol following bolus administration of intravenous contrast.  CONTRAST:  132m OMNIPAQUE IOHEXOL 300 MG/ML  SOLN  COMPARISON:  None.  FINDINGS: Lower chest: Scarring at both lung bases. Borderline cardiomegaly, without pericardial or pleural effusion. Surgical changes at the gastroesophageal junction.  Hepatobiliary: Subcentimeter right hepatic lobe low-density lesion is well-circumscribed and likely a cyst. Normal gallbladder, without biliary ductal dilatation.  Pancreas: Normal, without mass or ductal dilatation.  Spleen: Normal in size, without focal abnormality.  Adrenals/Urinary Tract: Normal left adrenal gland. Mild right adrenal nodularity. An upper pole left renal lesion is too small to characterize. A lower pole left renal lesion measures 3.4 cm and is consistent with a cyst. No hydronephrosis. Minimal air in the nondependent bladder.  Stomach/Bowel: Proximal gastric underdistention. Scattered colonic diverticula. Wall thickening involves the splenic flexure and descending colon. This is moderate. Mild surrounding edema. Normal terminal ileum. Appendix not visualized, but no pericecal inflammation identified.  Normal small bowel.  Vascular/Lymphatic: Normal caliber of the aorta and branch vessels. Celiac and mesenteric vessels patent, without significant atherosclerosis. No venous thrombus identified. No abdominopelvic adenopathy.  Reproductive: Normal uterus and adnexa.  Other: No significant free fluid. No free intraperitoneal air. pelvic floor laxity.  Musculoskeletal: Degenerative disc disease at the lumbosacral junction. S shaped thoracolumbar spine curvature.  IMPRESSION: 1. Left-sided colitis. Clinical history and distribution favor ischemia. No evidence arterial thrombus or venous occlusion. 2. Air within the urinary  bladder, likely iatrogenic. Correlate with instrumentation.   Electronically Signed   By: KAbigail MiyamotoM.D.   On: 08/05/2015 15:33   Assessment & Plan:   1. Recurrent UTI's  - criteria for recurrent UTI has been met with 2 or more infections in 6 months or 3 or greater infections in one year   - Patient is instructed to increase their water intake until the urine is pale yellow or clear (10 to 12 cups daily)   - probiotics (yogurt, oral pills or vaginal suppositories), take cranberry pills or drink the juice and Vitamin C 1,000 mg daily to acidify the urine should be added to their daily regimen   - avoid soaking in tubs and wipe front to back after urinating   - benefit from core strengthening exercises has been seen.  We can refer her to PT if they desire -  patient deferred  - advised them to have CATH UA's for urinalysis and culture to prevent skin contamination of the specimen  - reviewed symptoms of UTI and advised not to have urine checked or be treated for UTI if not experiencing symptoms  - discussed antibiotic stewardship with the patient    - CATH UA today had many bacteria  - UA for culture  2. Air in the bladder  - unknown if it was due to instrumentation, as it was one year ago  - most likely due to infectious process at that time  - will continue to monitor and if infections do not reduce with vaginal estrogen cream - will consider cystoscopy  3. Vaginal atrophy  - I explained to the patient that when women go through menopause and her estrogen levels are severely diminished, the normal vaginal flora will change.  This is due to an increase of the vaginal canal's pH. Because of this, the vaginal canal may be colonized by bacteria from the rectum instead of the protective lactobacillus.  This accompanied by the loss of the mucus barrier with vaginal atrophy is a cause of recurrent urinary tract infections.  - In some studies, the use of vaginal estrogen cream has been  demonstrated to reduce  recurrent urinary tract infections to one a year.   - Patient was given a sample of vaginal estrogen cream (Premarin) and instructed to apply 0.50m (pea-sized amount)  just inside the vaginal introitus with a finger-tip every night for two weeks and then Monday, Wednesday and Friday nights.  I explained to the patient that vaginally administered estrogen, which causes only a slight increase in the blood estrogen levels, have fewer contraindications and adverse systemic effects that oral HT.  - I have also given prescriptions for the Estrace cream and Premarin cream, so that the patient may carry them to the pharmacy to see which one of the branded creams would be most economical for her.  If she finds both medications cost prohibitive, she is instructed to call the office.  We can then call in a compounded vaginal estrogen cream for the patient that may be more affordable.    - She will follow up in three months for an exam.    4. Mixed Incontinence  - offered behavioral therapies, bladder training, bladder control strategies and pelvic floor muscle training - patient deferred  - fluid management - encouraged patient to increase her water  - did not offer medical therapy with anticholinergic therapy or beta-3 adrenergic receptor agonist and the potential side effects of each therapy as her PVR was moderate and did not want to risk retention  - offered refer to gynecology for a pessary fitting - patient's aunt developed pancreatic cancer and had a pessary, so she does not want one  - offered an appointment with one of our surgeon for a possible pelvic sling procedure - explained that her BMI is too high and she would need to lose weight to consider this option   5. Cystocele  - see above                         Return for pending urine reults.  These notes generated with voice recognition software. I apologize for typographical errors.  SZara Council [facility] [address] [place] [phone]

## 2016-09-15 NOTE — Patient Instructions (Signed)

## 2016-09-15 NOTE — Addendum Note (Signed)
Addended by: Orlene Erm on: 09/15/2016 11:14 AM   Modules accepted: Orders

## 2016-09-17 LAB — URINE CULTURE

## 2016-09-18 ENCOUNTER — Telehealth: Payer: Self-pay | Admitting: *Deleted

## 2016-09-18 NOTE — Telephone Encounter (Signed)
Spoke with patient and gave message. Patient states is not having any symptoms at this time.

## 2016-09-18 NOTE — Telephone Encounter (Signed)
-----   Message from Nori Riis, PA-C sent at 09/17/2016  8:25 PM EDT ----- Please notify the patient that the bacteria that she grew out is a commensal organisms and is mostly found in the mouth.  It can also be found in the GU tract, but it rarely causes issues.  I suggest that if she is symptomatic at this time, we can start Keflex 500 mg twice daily for 7 days.

## 2016-09-25 ENCOUNTER — Telehealth: Payer: Self-pay | Admitting: *Deleted

## 2016-09-25 NOTE — Telephone Encounter (Signed)
Spoke with patient and gave message patient transferred to Taylor Morrow to make follow up appointment.

## 2016-09-25 NOTE — Telephone Encounter (Signed)
-----   Message from Nori Riis, PA-C sent at 09/24/2016  8:42 PM EDT ----- The patient needs to return in 3 months for an exam.

## 2016-12-25 NOTE — Progress Notes (Deleted)
12/26/2016 9:41 AM   Taylor Morrow 16-Nov-1945 [id number]  Referring provider: No referring provider defined for this encounter.  No chief complaint on file.   HPI: 71 yo WF with a history of recurrent UTI's, vaginal atrophy, mixed urinary incontinence and cystocele.    Background history Patient is a 21 -year-old Caucasian female who is referred to [name] by, [name], NP, for recurrent urinary tract infections.  Patient states that she has had 7 urinary tract infections over the last year.  Her symptoms with a urinary tract infection consist of malodorous urine, lower back pain, cloudy urine and dysuria.  She denies gross hematuria, suprapubic pain, abdominal pain or flank pain.  She has not had any recent fevers, chills, nausea or vomiting.   She does not have a history of nephrolithiasis, GU surgery or GU trauma.   Reviewing her records,  she has had 5 documented positive urine cultures .  + KLUYVERU SP   on 10/23/2015  + E. Coli on 04/03/2016    + E. Coli on 06/08/2016  + E. Coli on 07/28/2016  + Staph epidermidis on 08/15/2016  She is not sexually active.   She is post menopausal.  She admits to constipation.  She does engage in good perineal hygiene. She does not take tub baths.  She does have incontinence.  She is using incontinence pads.  She is going through 5 thick Poise pads daily.  She has moderate volume of urine loss, but she has not soaked through her pads.  She is having not having pain with bladder filling.  Contrast CT of abdomen and pelvis was performed on 08/05/2015 for rectal bleeding. Findings were for  left-sided colitis. Clinical history and distribution favor ischemia. No evidence arterial thrombus or venous occlusion.  Air within the urinary bladder, likely iatrogenic. Correlate with instrumentation.  I have independently reviewed the films.  She has not had pneumaturia or passage of stool through her urine. She also has not had abdominal surgery,  inflammatory bowel disease or diverticulitis.  She is drinking 2 glasses of water daily.   2 cups of coffee.  2 cups of orange juice.    Her PVR today is 219 mL.    At her visit three months ago, she was offered PT and/or pessary fitting.  She deferred.  She was advised on UTI prevention strategies and asked to contact us with symptoms of an UTI.  She has not had symptoms of UTIs since her last visit with [name] 3 months ago.  ***    She was initiated on vaginal estrogen cream. She has been applying a 3 nights weekly. She is not had a vaginal rash or irritation.  ***   She denies dysuria, gross hematuria and suprapubic pain. She's not had any recent fevers, chills, nausea or vomiting.  The patient has been experiencing urgency x *** (***), frequency x *** (***), not/is restricting fluids to avoid visits to the restroom ***, not/is engaging in toilet mapping, incontinence x *** (***) and nocturia x *** (***).     PMH: Past Medical History:  Diagnosis Date  . Hypertension   . Vitamin D deficiency     Surgical History: Past Surgical History:  Procedure Laterality Date  . BACK SURGERY    . COLONOSCOPY WITH PROPOFOL N/A 10/27/2015   Procedure: COLONOSCOPY WITH PROPOFOL;  Surgeon: Manya Silvas, MD;  Location[facility] ENDOSCOPY;  Service: Endoscopy;  Laterality: N/A;  . FOOT SURGERY Bilateral  right 2013  left  2014  . HERNIA REPAIR     2007  . ROTATOR CUFF REPAIR Right   . THYROID SURGERY      Home Medications:  Allergies as of 12/26/2016   No Known Allergies     Medication List       Accurate as of 12/25/16  9:41 AM. Always use your most recent med list.          alendronate 70 MG tablet Commonly known as:  FOSAMAX Take 70 mg by mouth once a week. Take with a full glass of water on an empty stomach.   amoxicillin-clavulanate 875-125 MG tablet Commonly known as:  AUGMENTIN Take 1 tablet by mouth every 12 (twelve) hours.   calcium carbonate 1250 (500 Ca) MG tablet Commonly  known as:  OS-CAL - dosed in mg of elemental calcium Take 1 tablet by mouth 2 (two) times daily with a meal.   conjugated estrogens vaginal cream Commonly known as:  PREMARIN Place 1 Applicatorful vaginally daily. Apply 0.5mg  (pea-sized amount)  just inside the vaginal introitus with a finger-tip every night for two weeks and then Monday, Wednesday and Friday nights.   ergocalciferol 50000 units capsule Commonly known as:  VITAMIN D2 Take 50,000 Units by mouth once a week.   estradiol 0.1 MG/GM vaginal cream Commonly known as:  ESTRACE VAGINAL Apply 0.5mg  (pea-sized amount)  just inside the vaginal introitus with a finger-tip every night for two weeks and then Monday, Wednesday and Friday nights.   lisinopril 10 MG tablet Commonly known as:  PRINIVIL,ZESTRIL Take 10 mg by mouth daily.   magnesium oxide 400 MG tablet Commonly known as:  MAG-OX Take 400 mg by mouth daily.   OMEGA 3-6-9 FATTY ACIDS PO Take 1,000 mg by mouth daily.       Allergies: No Known Allergies  Family History: Family History  Problem Relation Age of Onset  . Hypertension Mother   . Prostate cancer Neg Hx   . Kidney cancer Neg Hx   . Bladder Cancer Neg Hx     Social History:  reports that she has never smoked. She has never used smokeless tobacco. She reports that she does not drink alcohol or use drugs.  ROS:                                        Physical Exam: There were no vitals taken for this visit.  Constitutional: Well nourished. Alert and oriented, No acute distress. HEENT: [place] AT, moist mucus membranes. Trachea midline, no masses. Cardiovascular: No clubbing, cyanosis, or edema. Respiratory: Normal respiratory effort, no increased work of breathing. GI: Abdomen is soft, non tender, non distended, no abdominal masses. Liver and spleen not palpable.  No hernias appreciated.  Stool sample for occult testing is not indicated.   GU: No CVA tenderness.  No bladder  fullness or masses.  Atrophic external genitalia, normal pubic hair distribution, no lesions.  Normal urethral meatus, no lesions, no prolapse, no discharge.   No urethral masses, tenderness and/or tenderness. No bladder fullness, tenderness or masses. Pale vagina mucosa, poor estrogen effect, no discharge, no lesions, good pelvic support, Grade II cystocele is noted No rectocele noted.  Urine leakage with Valsalva.  No cervical motion tenderness.  Uterus is freely mobile and non-fixed.  No adnexal/parametria masses or tenderness noted.  Anus and perineum are without rashes or lesions.  Skin: No rashes, bruises or suspicious lesions. Lymph: No cervical or inguinal adenopathy. Neurologic: Grossly intact, no focal deficits, moving all 4 extremities. Psychiatric: Normal mood and affect.  Laboratory Data: Lab Results  Component Value Date   WBC 6.3 08/06/2015   HGB 11.0 (L) 08/07/2015   HCT 37.4 08/06/2015   MCV 88.3 08/06/2015   PLT 217 08/06/2015    Lab Results  Component Value Date   CREATININE 0.94 08/06/2015    Lab Results  Component Value Date   AST 20 08/05/2015   Lab Results  Component Value Date   ALT 16 08/05/2015    Urine cultures + KLUYVERU SP   on 10/23/2015  + E. Coli on 04/03/2016    + E. Coli on 06/08/2016  + E. Coli on 07/28/2016  + Staph epidermidis on 08/15/2016  Pertinent Imaging: ***  Assessment & Plan:   1. Recurrent UTI's  - Reviewed UTI preventative strategies  - advised them to have CATH UA's for urinalysis and culture to prevent skin contamination of the specimen  - reviewed symptoms of UTI and advised not to have urine checked or be treated for UTI if not experiencing symptoms   2. Air in the bladder  - most likely resolved as patent has not had serious sequela over the last year  3. Vaginal atrophy  - She will continue the vaginal estrogen cream 3 nights weekly  - RTC in one year for exam  4. Mixed Incontinence  - offered behavioral  therapies, bladder training, bladder control strategies and pelvic floor muscle training - patient deferred  - fluid management - encouraged patient to increase her water  - did not offer medical therapy with anticholinergic therapy or beta-3 adrenergic receptor agonist and the potential side effects of each therapy as her PVR was moderate and did not want to risk retention  - offered refer to gynecology for a pessary fitting - patient's aunt developed pancreatic cancer and had a pessary, so she does not want one  - offered an appointment with one of our surgeon for a possible pelvic sling procedure - explained that her BMI is too high and she would need to lose weight to consider this option   5. Cystocele  - see above                         No Follow-up on file.  These notes generated with voice recognition software. I apologize for typographical errors.  Zara Council, [facility] [address] [place], [place] [address] [phone]

## 2016-12-26 ENCOUNTER — Encounter: Payer: Self-pay | Admitting: Urology

## 2016-12-26 ENCOUNTER — Ambulatory Visit: Payer: Medicare Other | Admitting: Urology

## 2017-01-04 NOTE — Progress Notes (Signed)
01/05/2017 9:27 AM   Kathaleen Grinder 1946/04/18 [id number]  Referring provider: No referring provider defined for this encounter.  Chief Complaint  Patient presents with  . Recurrent UTI    21month    HPI: 71 yo WF with a history of recurrent UTI's, vaginal atrophy, mixed urinary incontinence and cystocele.    Background history Patient is a 69 -year-old Caucasian female who is referred to [name] by, [name], NP, for recurrent urinary tract infections.  Patient states that she has had 7 urinary tract infections over the last year.  Her symptoms with a urinary tract infection consist of malodorous urine, lower back pain, cloudy urine and dysuria.  She denies gross hematuria, suprapubic pain, abdominal pain or flank pain.  She has not had any recent fevers, chills, nausea or vomiting.   She does not have a history of nephrolithiasis, GU surgery or GU trauma.   Reviewing her records,  she has had 5 documented positive urine cultures .  + KLUYVERU SP   on 10/23/2015  + E. Coli on 04/03/2016    + E. Coli on 06/08/2016  + E. Coli on 07/28/2016  + Staph epidermidis on 08/15/2016  She is not sexually active.   She is post menopausal.  She admits to constipation.  She does engage in good perineal hygiene. She does not take tub baths.  She does have incontinence.  She is using incontinence pads.  She is going through 5 thick [place] pads daily.  She has moderate volume of urine loss, but she has not soaked through her pads.  She is having not having pain with bladder filling.  Contrast CT of abdomen and pelvis was performed on 08/05/2015 for rectal bleeding. Findings were for  left-sided colitis. Clinical history and distribution favor ischemia. No evidence arterial thrombus or venous occlusion.  Air within the urinary bladder, likely iatrogenic. Correlate with instrumentation.  I have independently reviewed the films.  She has not had pneumaturia or passage of stool through her urine.  She also has not had abdominal surgery, inflammatory bowel disease or diverticulitis.  She is drinking 2 glasses of water daily.   2 cups of coffee.  2 cups of orange juice.    Her PVR is 219 mL.    At her visit three months ago, she was offered PT and/or pessary fitting.  She deferred.  She was advised on UTI prevention strategies and asked to contact us with symptoms of an UTI.  She has not had symptoms of UTI's since her last visit with [name] 3 months ago.    She was initiated on vaginal estrogen cream. She has been applying a 3 nights weekly. She is not had a vaginal rash or irritation.   She denies dysuria, gross hematuria and suprapubic pain. She's not had any recent fevers, chills, nausea or vomiting.  The patient has been experiencing urgency x 0-3, frequency x 4-7, is restricting fluids to avoid visits to the restroom, is engaging in toilet mapping, incontinence x 0-3 and nocturia x 0-3.  Her PVR is 130 mL.     PMH: Past Medical History:  Diagnosis Date  . Hypertension   . Vitamin D deficiency     Surgical History: Past Surgical History:  Procedure Laterality Date  . BACK SURGERY    . COLONOSCOPY WITH PROPOFOL N/A 10/27/2015   Procedure: COLONOSCOPY WITH PROPOFOL;  Surgeon: Manya Silvas, MD;  Location: [facility] ENDOSCOPY;  Service: Endoscopy;  Laterality: N/A;  .  FOOT SURGERY Bilateral    right 2013  left  2014  . HERNIA REPAIR     2007  . ROTATOR CUFF REPAIR Right   . THYROID SURGERY      Home Medications:  Allergies as of 01/05/2017   No Known Allergies     Medication List       Accurate as of 01/05/17  9:27 AM. Always use your most recent med list.          alendronate 70 MG tablet Commonly known as:  FOSAMAX Take 70 mg by mouth once a week. Take with a full glass of water on an empty stomach.   calcium carbonate 1250 (500 Ca) MG tablet Commonly known as:  OS-CAL - dosed in mg of elemental calcium Take 1 tablet by mouth 2 (two) times daily with a meal.     ergocalciferol 50000 units capsule Commonly known as:  VITAMIN D2 Take 50,000 Units by mouth once a week.   estradiol 0.1 MG/GM vaginal cream Commonly known as:  ESTRACE VAGINAL Apply 0.5mg  (pea-sized amount)  just inside the vaginal introitus with a finger-tip every night for two weeks and then Monday, Wednesday and Friday nights.   lisinopril 10 MG tablet Commonly known as:  PRINIVIL,ZESTRIL Take 10 mg by mouth daily.   magnesium oxide 400 MG tablet Commonly known as:  MAG-OX Take 400 mg by mouth daily.   OMEGA 3-6-9 FATTY ACIDS PO Take 1,000 mg by mouth daily.       Allergies: No Known Allergies  Family History: Family History  Problem Relation Age of Onset  . Hypertension Mother   . Prostate cancer Neg Hx   . Kidney cancer Neg Hx   . Bladder Cancer Neg Hx     Social History:  reports that she has never smoked. She has never used smokeless tobacco. She reports that she does not drink alcohol or use drugs.  ROS: UROLOGY Frequent Urination?: No Hard to postpone urination?: No Burning/pain with urination?: No Get up at night to urinate?: No Leakage of urine?: No Urine stream starts and stops?: No Trouble starting stream?: No Do you have to strain to urinate?: No Blood in urine?: No Urinary tract infection?: No Sexually transmitted disease?: No Injury to kidneys or bladder?: No Painful intercourse?: No Weak stream?: No Currently pregnant?: No Vaginal bleeding?: No Last menstrual period?: n  Gastrointestinal Nausea?: No Vomiting?: No Indigestion/heartburn?: No Diarrhea?: No Constipation?: No  Constitutional Fever: No Night sweats?: No Weight loss?: No Fatigue?: No  Skin Skin rash/lesions?: No Itching?: No  Eyes Blurred vision?: No Double vision?: No  Ears/Nose/Throat Sore throat?: No Sinus problems?: No  Hematologic/Lymphatic Swollen glands?: No Easy bruising?: No  Cardiovascular Leg swelling?: No Chest pain?:  No  Respiratory Cough?: No Shortness of breath?: No  Endocrine Excessive thirst?: No  Musculoskeletal Back pain?: No Joint pain?: No  Neurological Headaches?: No Dizziness?: No  Psychologic Depression?: No Anxiety?: No  Physical Exam: BP 139/85   Pulse 78   Ht 5\' 1"  (1.549 m)   Wt 195 lb (88.5 kg)   BMI 36.84 kg/m   Constitutional: Well nourished. Alert and oriented, No acute distress. HEENT: [place] AT, moist mucus membranes. Trachea midline, no masses. Cardiovascular: No clubbing, cyanosis, or edema. Respiratory: Normal respiratory effort, no increased work of breathing. GI: Abdomen is soft, non tender, non distended, no abdominal masses. Liver and spleen not palpable.  No hernias appreciated.  Stool sample for occult testing is not indicated.   GU:  No CVA tenderness.  No bladder fullness or masses.  Atrophic external genitalia, normal pubic hair distribution, no lesions.  Normal urethral meatus, no lesions, no prolapse, no discharge.   No urethral masses, tenderness and/or tenderness. No bladder fullness, tenderness or masses. Pale vagina mucosa, poor estrogen effect, no discharge, no lesions, good pelvic support, Grade II cystocele is noted No rectocele noted.  Urine leakage with Valsalva.  No cervical motion tenderness.  Uterus is freely mobile and non-fixed.  No adnexal/parametria masses or tenderness noted.  Anus and perineum are without rashes or lesions.    Skin: No rashes, bruises or suspicious lesions. Lymph: No cervical or inguinal adenopathy. Neurologic: Grossly intact, no focal deficits, moving all 4 extremities. Psychiatric: Normal mood and affect.  Laboratory Data: Urine cultures + KLUYVERU SP   on 10/23/2015  + E. Coli on 04/03/2016    + E. Coli on 06/08/2016  + E. Coli on 07/28/2016  + Staph epidermidis on 08/15/2016  I have reviewed the labs  Pertinent Imaging: Results for MIRYAH, RALLS (MRN [id number]) as of 01/05/2017 09:21  Ref. Range  01/05/2017 09:00  Scan Result Unknown 160ml    Assessment & Plan:   1. Recurrent UTI's  - Reviewed UTI preventative strategies  - advised them to have CATH UA's for urinalysis and culture to prevent skin contamination of the specimen  - reviewed symptoms of UTI and advised not to have urine checked or be treated for UTI if not experiencing symptoms   2. Vaginal atrophy  - She will continue the vaginal estrogen cream 3 nights weekly  - RTC in one year for exam  4. Mixed Incontinence  - offered behavioral therapies, bladder training, bladder control strategies and pelvic floor muscle training - patient deferred again  - fluid management - encouraged patient to increase her water  - did not offer medical therapy with anticholinergic therapy or beta-3 adrenergic receptor agonist and the potential side effects of each therapy as her PVR was moderate and did not want to risk retention  - offered refer to gynecology for a pessary fitting - patient would like a referral for a pessary  - offered an appointment with one of our surgeon for a possible pelvic sling procedure - explained that her BMI is too high and she would need to lose weight to consider this option   5. Cystocele  - see above                         Return in about 1 year (around 01/05/2018) for OAB questionnaire, PVR and exam.  These notes generated with voice recognition software. I apologize for typographical errors.  Zara Council, [facility] [address] [place], [place] [address] [phone]

## 2017-01-05 ENCOUNTER — Encounter: Payer: Self-pay | Admitting: Urology

## 2017-01-05 ENCOUNTER — Ambulatory Visit (INDEPENDENT_AMBULATORY_CARE_PROVIDER_SITE_OTHER): Payer: Medicare Other | Admitting: Urology

## 2017-01-05 VITALS — BP 139/85 | HR 78 | Ht 61.0 in | Wt 195.0 lb

## 2017-01-05 DIAGNOSIS — N8111 Cystocele, midline: Secondary | ICD-10-CM | POA: Diagnosis not present

## 2017-01-05 DIAGNOSIS — N3946 Mixed incontinence: Secondary | ICD-10-CM

## 2017-01-05 DIAGNOSIS — Z87440 Personal history of urinary (tract) infections: Secondary | ICD-10-CM

## 2017-01-05 DIAGNOSIS — N952 Postmenopausal atrophic vaginitis: Secondary | ICD-10-CM

## 2017-01-05 LAB — BLADDER SCAN AMB NON-IMAGING

## 2017-01-11 ENCOUNTER — Telehealth: Payer: Self-pay | Admitting: Urology

## 2017-01-11 NOTE — Telephone Encounter (Signed)
°  I got a call from [facility] today and this is what they said the patient said to them about her referral See below    I called to schedule appointment for patient. Patient stated she didn't want the Pessary and She refused our MD providers because they aren't Female. Advise pt to call [place] about schedule with another provider.  Then I called the patient and this is what she told me-----------  Spoke with the patient today and she said she did not want to proceed with the referral at this time. I told her that I could send it over to [facility] if she changed her mind in the future and she said she would call me back if she did.  Sharyn Lull

## 2017-03-23 DIAGNOSIS — IMO0002 Reserved for concepts with insufficient information to code with codable children: Secondary | ICD-10-CM | POA: Insufficient documentation

## 2017-03-23 DIAGNOSIS — M1120 Other chondrocalcinosis, unspecified site: Secondary | ICD-10-CM | POA: Insufficient documentation

## 2017-03-23 DIAGNOSIS — M7062 Trochanteric bursitis, left hip: Secondary | ICD-10-CM | POA: Insufficient documentation

## 2017-04-24 ENCOUNTER — Other Ambulatory Visit: Payer: Self-pay | Admitting: Nurse Practitioner

## 2017-04-24 DIAGNOSIS — R1032 Left lower quadrant pain: Secondary | ICD-10-CM

## 2017-04-27 ENCOUNTER — Ambulatory Visit
Admission: RE | Admit: 2017-04-27 | Discharge: 2017-04-27 | DRG: 881 | Disposition: A | Discharge status: Home / Self Care | Payer: Medicare Other | Source: Ambulatory Visit | Attending: Nurse Practitioner | Admitting: Nurse Practitioner

## 2017-04-27 DIAGNOSIS — R1032 Left lower quadrant pain: Secondary | ICD-10-CM | POA: Diagnosis not present

## 2017-05-03 DIAGNOSIS — K760 Fatty (change of) liver, not elsewhere classified: Secondary | ICD-10-CM | POA: Insufficient documentation

## 2018-01-04 DIAGNOSIS — M47816 Spondylosis without myelopathy or radiculopathy, lumbar region: Secondary | ICD-10-CM | POA: Insufficient documentation

## 2018-01-11 ENCOUNTER — Ambulatory Visit: Payer: Medicare Other | Admitting: Urology

## 2018-01-15 NOTE — Progress Notes (Signed)
01/16/2018 10:06 AM   Taylor Morrow Morrow [id number]  Referring provider: [name], NP [name], [place] [address]  Chief Complaint  Patient presents with  . Vaginal Atrophy  . Urinary Incontinence    HPI: 72 yo WF with a history of recurrent UTI's, vaginal atrophy, mixed urinary incontinence and cystocele.    History of recurrent UTI's She states that she has had three of four UTI's over the last year.  They were treated at her PCP's office.  She states they have been doing cultures.  She was placed on prophylaxis, Keflex 250 mg daily, by her PCP.    Her symptoms were LBP with pain running down her legs and cloudy urine.    Risk factors for UTI's: age, constipation, vaginal atrophy and incontinence  Today, she is not having symptoms of an UTI.  Vaginal atrophy She is not using the vaginal estrogen cream as she states that it makes her itch.       Mixed urinary incontinence She does not have much issue with incontinence during the day.  She is getting up 3 to 4 times at night.  She does drink a bottle of water at night.  Her PVR is 76 mL.    Cystocele Not interested in pessary referral   PMH: Past Medical History:  Diagnosis Date  . Hypertension   . Vitamin D deficiency     Surgical History: Past Surgical History:  Procedure Laterality Date  . BACK SURGERY    . COLONOSCOPY WITH PROPOFOL N/A 10/27/2015   Procedure: COLONOSCOPY WITH PROPOFOL;  Surgeon: Taylor Silvas, MD;  Location: [facility] ENDOSCOPY;  Service: Endoscopy;  Laterality: N/A;  . FOOT SURGERY Bilateral    right 2013  left  2014  . HERNIA REPAIR     2007  . ROTATOR CUFF REPAIR Right   . THYROID SURGERY      Home Medications:  Allergies as of 01/16/2018   No Known Allergies     Medication List        Accurate as of 01/16/18 10:06 AM. Always use your most recent med list.          alendronate 70 MG tablet Commonly known as:  FOSAMAX Take 70 mg by mouth  once a week. Take with a full glass of water on an empty stomach.   calcium carbonate 1250 (500 Ca) MG tablet Commonly known as:  OS-CAL - dosed in mg of elemental calcium Take 1 tablet by mouth 2 (two) times daily with a meal.   ergocalciferol 50000 units capsule Commonly known as:  VITAMIN D2 Take 50,000 Units by mouth once a week.   estradiol 0.1 MG/GM vaginal cream Commonly known as:  ESTRACE VAGINAL Apply 0.5mg  (pea-sized amount)  just inside the vaginal introitus with a finger-tip every night for two weeks and then Monday, Wednesday and Friday nights.   lisinopril 10 MG tablet Commonly known as:  PRINIVIL,ZESTRIL Take 10 mg by mouth daily.   magnesium oxide 400 MG tablet Commonly known as:  MAG-OX Take 400 mg by mouth daily.   OMEGA 3-6-9 FATTY ACIDS PO Take 1,000 mg by mouth daily.       Allergies: No Known Allergies  Family History: Family History  Problem Relation Age of Onset  . Hypertension Mother   . Prostate cancer Neg Hx   . Kidney cancer Neg Hx   . Bladder Cancer Neg Hx     Social History:  reports that she  has never smoked. She has never used smokeless tobacco. She reports that she does not drink alcohol or use drugs.  ROS: UROLOGY Frequent Urination?: No Hard to postpone urination?: No Burning/pain with urination?: No Get up at night to urinate?: Yes Leakage of urine?: No Urine stream starts and stops?: No Trouble starting stream?: No Do you have to strain to urinate?: No Blood in urine?: No Urinary tract infection?: Yes Sexually transmitted disease?: No Injury to kidneys or bladder?: No Painful intercourse?: No Weak stream?: No Currently pregnant?: No Vaginal bleeding?: No Last menstrual period?: n  Gastrointestinal Nausea?: No Vomiting?: No Indigestion/heartburn?: No Diarrhea?: No Constipation?: No  Constitutional Fever: No Night sweats?: No Weight loss?: No Fatigue?: No  Skin Skin rash/lesions?: No Itching?:  No  Eyes Blurred vision?: No Double vision?: No  Ears/Nose/Throat Sore throat?: No Sinus problems?: No  Hematologic/Lymphatic Swollen glands?: No Easy bruising?: No  Cardiovascular Leg swelling?: Yes Chest pain?: No  Respiratory Cough?: No Shortness of breath?: No  Endocrine Excessive thirst?: No  Musculoskeletal Back pain?: Yes Joint pain?: No  Neurological Headaches?: No Dizziness?: No  Psychologic Depression?: No Anxiety?: No  Physical Exam: BP 120/79   Pulse 81   Ht 5\' 1"  (1.549 m)   Wt 195 lb (88.5 kg)   BMI 36.84 kg/m   Constitutional: Well nourished. Alert and oriented, No acute distress. HEENT: [place] AT, moist mucus membranes. Trachea midline, no masses. Cardiovascular: No clubbing, cyanosis, or edema. Respiratory: Normal respiratory effort, no increased work of breathing. Skin: No rashes, bruises or suspicious lesions. Lymph: No cervical or inguinal adenopathy. Neurologic: Grossly intact, no focal deficits, moving all 4 extremities. Psychiatric: Normal mood and affect.   Laboratory Data: Urine cultures + KLUYVERU SP   on 10/23/2015  + E. Coli on 04/03/2016    + E. Coli on 06/08/2016  + E. Coli on 07/28/2016  + Staph epidermidis on 08/15/2016  I have reviewed the labs  Pertinent Imaging: Results for Taylor Morrow, Morrow (MRN [id number]) as of 01/16/2018 09:58  Ref. Range 01/16/2018 09:34  Scan Result Unknown 6ml     Assessment & Plan:   1. Recurrent UTI's Reviewed UTI prevention strategies  Discussed the increase risk of resistance of bacteria developing due to suppressive antibiotic treatment - she will stop the Keflex at this time   2. Vaginal atrophy She will restart the vaginal cream three nights weekly RTC in one year for exam  3. Mixed Incontinence Discussed behavioral therapies, bladder training, bladder control strategies Patient not interested in pelvic floor muscle training  Patient not interested in medical  therapy  4. Nocturia Patient has a history of sleep apnea and will think about getting retested   5. Cystocele  - see above                       Return in about 1 year (around 01/17/2019) for PVR, exam and OAB questionnaire.  These notes generated with voice recognition software. I apologize for typographical errors.  Taylor Council, PA-C  [facility] [address] [place], [place] [address] [phone]

## 2018-01-16 ENCOUNTER — Ambulatory Visit (INDEPENDENT_AMBULATORY_CARE_PROVIDER_SITE_OTHER): Payer: Medicare Other | Admitting: Urology

## 2018-01-16 ENCOUNTER — Encounter: Payer: Self-pay | Admitting: Urology

## 2018-01-16 VITALS — BP 120/79 | HR 81 | Ht 61.0 in | Wt 195.0 lb

## 2018-01-16 DIAGNOSIS — N952 Postmenopausal atrophic vaginitis: Secondary | ICD-10-CM

## 2018-01-16 DIAGNOSIS — N8111 Cystocele, midline: Secondary | ICD-10-CM | POA: Diagnosis not present

## 2018-01-16 DIAGNOSIS — Z87440 Personal history of urinary (tract) infections: Secondary | ICD-10-CM

## 2018-01-16 DIAGNOSIS — N3946 Mixed incontinence: Secondary | ICD-10-CM

## 2018-01-16 LAB — BLADDER SCAN AMB NON-IMAGING

## 2018-01-16 MED ORDER — ESTRADIOL 0.1 MG/GM VA CREA
TOPICAL_CREAM | VAGINAL | 12 refills | Status: DC
Start: 2018-01-16 — End: 2022-06-29

## 2018-01-18 ENCOUNTER — Ambulatory Visit: Payer: Medicare Other | Admitting: Urology

## 2018-12-11 ENCOUNTER — Other Ambulatory Visit: Payer: Self-pay | Admitting: Physical Medicine and Rehabilitation

## 2018-12-11 DIAGNOSIS — M5416 Radiculopathy, lumbar region: Secondary | ICD-10-CM

## 2018-12-23 ENCOUNTER — Other Ambulatory Visit: Payer: Self-pay

## 2018-12-23 ENCOUNTER — Ambulatory Visit
Admission: RE | Admit: 2018-12-23 | Discharge: 2018-12-23 | Disposition: A | Discharge status: Home / Self Care | Payer: Medicare Other | Source: Ambulatory Visit | Attending: Physical Medicine and Rehabilitation | Admitting: Physical Medicine and Rehabilitation

## 2018-12-23 DIAGNOSIS — M5416 Radiculopathy, lumbar region: Secondary | ICD-10-CM | POA: Diagnosis not present

## 2019-01-21 NOTE — Progress Notes (Deleted)
01/22/2019 12:40 PM   Taylor Morrow 07-12-1945 [id number]  Referring provider: [name], NP [place],  [place] [address]  No chief complaint on file.   HPI: 73 yo WF with a history of recurrent UTI's, vaginal atrophy, mixed urinary incontinence and cystocele.    History of recurrent UTI's She states that she has had three of four UTI's over the last year.  They were treated at her PCP's office.  She states they have been doing cultures.  She was placed on prophylaxis, Keflex 250 mg daily, by her PCP.    Her symptoms were LBP with pain running down her legs and cloudy urine.    Risk factors for UTI's: age, constipation, vaginal atrophy and incontinence  Today, she is not having symptoms of an UTI.  Vaginal atrophy She is not using the vaginal estrogen cream as she states that it makes her itch.       Mixed urinary incontinence She does not have much issue with incontinence during the day.  She is getting up 3 to 4 times at night.  She does drink a bottle of water at night.  Her PVR is 76 mL.    Cystocele Not interested in pessary referral   PMH: Past Medical History:  Diagnosis Date   Hypertension    Vitamin D deficiency     Surgical History: Past Surgical History:  Procedure Laterality Date   BACK SURGERY     COLONOSCOPY WITH PROPOFOL N/A 10/27/2015   Procedure: COLONOSCOPY WITH PROPOFOL;  Surgeon: Manya Silvas, MD;  Location: [facility];  Service: Endoscopy;  Laterality: N/A;   FOOT SURGERY Bilateral    right 2013  left  2014   HERNIA REPAIR     2007   ROTATOR CUFF REPAIR Right    THYROID SURGERY      Home Medications:  Allergies as of 01/22/2019   No Known Allergies     Medication List       Accurate as of January 21, 2019 12:40 PM. If you have any questions, ask your nurse or doctor.        alendronate 70 MG tablet Commonly known as: FOSAMAX Take 70 mg by mouth once a week. Take with a full glass of  water on an empty stomach.   calcium carbonate 1250 (500 Ca) MG tablet Commonly known as: OS-CAL - dosed in mg of elemental calcium Take 1 tablet by mouth 2 (two) times daily with a meal.   ergocalciferol 1.25 MG (50000 UT) capsule Commonly known as: VITAMIN D2 Take 50,000 Units by mouth once a week.   estradiol 0.1 MG/GM vaginal cream Commonly known as: ESTRACE VAGINAL Apply 0.5mg  (pea-sized amount)  just inside the vaginal introitus with a finger-tip every night for two weeks and then Monday, Wednesday and Friday nights.   lisinopril 10 MG tablet Commonly known as: ZESTRIL Take 10 mg by mouth daily.   magnesium oxide 400 MG tablet Commonly known as: MAG-OX Take 400 mg by mouth daily.   OMEGA 3-6-9 FATTY ACIDS PO Take 1,000 mg by mouth daily.       Allergies: No Known Allergies  Family History: Family History  Problem Relation Age of Onset   Hypertension Mother    Prostate cancer Neg Hx    Kidney cancer Neg Hx    Bladder Cancer Neg Hx     Social History:  reports that she has never smoked. She has never used smokeless tobacco. She reports  that she does not drink alcohol or use drugs.  ROS:                                        Physical Exam: There were no vitals taken for this visit.  Constitutional:  Well nourished. Alert and oriented, No acute distress. HEENT: [place] AT, moist mucus membranes.  Trachea midline, no masses. Cardiovascular: No clubbing, cyanosis, or edema. Respiratory: Normal respiratory effort, no increased work of breathing. GI: Abdomen is soft, non tender, non distended, no abdominal masses. Liver and spleen not palpable.  No hernias appreciated.  Stool sample for occult testing is not indicated.   GU: No CVA tenderness.  No bladder fullness or masses.  *** external genitalia, *** pubic hair distribution, no lesions.  Normal urethral meatus, no lesions, no prolapse, no discharge.   No urethral masses, tenderness and/or  tenderness. No bladder fullness, tenderness or masses. *** vagina mucosa, *** estrogen effect, no discharge, no lesions, *** pelvic support, *** cystocele and *** rectocele noted.  No cervical motion tenderness.  Uterus is freely mobile and non-fixed.  No adnexal/parametria masses or tenderness noted.  Anus and perineum are without rashes or lesions.   ***  Skin: No rashes, bruises or suspicious lesions. Lymph: No cervical or inguinal adenopathy. Neurologic: Grossly intact, no focal deficits, moving all 4 extremities. Psychiatric: Normal mood and affect.   Laboratory Data: Urine cultures  + KLUYVERU SP   on 10/23/2015  + E. Coli on 04/03/2016    + E. Coli on 06/08/2016  + E. Coli on 07/28/2016  + Staph epidermidis on 08/15/2016  I have reviewed the labs  Pertinent Imaging: ***    Assessment & Plan:   1. Recurrent UTI's Reviewed UTI prevention strategies  Discussed the increase risk of resistance of bacteria developing due to suppressive antibiotic treatment - she will stop the Keflex at this time   2. Vaginal atrophy She will restart the vaginal cream three nights weekly RTC in one year for exam  3. Mixed Incontinence Discussed behavioral therapies, bladder training, bladder control strategies Patient not interested in pelvic floor muscle training  Patient not interested in medical therapy  4. Nocturia Patient has a history of sleep apnea and will think about getting retested   5. Cystocele  - see above                       No follow-ups on file.  These notes generated with voice recognition software. I apologize for typographical errors.  Zara Council, PA-C  [facility] [address] [place],  [address] [phone]

## 2019-01-22 ENCOUNTER — Ambulatory Visit: Payer: Medicare Other | Admitting: Urology

## 2019-04-03 ENCOUNTER — Other Ambulatory Visit
Admission: RE | Admit: 2019-04-03 | Discharge: 2019-04-03 | Disposition: A | Discharge status: Home / Self Care | Payer: Medicare Other | Source: Ambulatory Visit | Attending: Internal Medicine | Admitting: Internal Medicine

## 2019-04-03 ENCOUNTER — Other Ambulatory Visit: Payer: Self-pay

## 2019-04-03 DIAGNOSIS — Z20828 Contact with and (suspected) exposure to other viral communicable diseases: Secondary | ICD-10-CM | POA: Insufficient documentation

## 2019-04-04 LAB — SARS CORONAVIRUS 2 (TAT 6-24 HRS): SARS Coronavirus 2: NEGATIVE

## 2019-04-07 ENCOUNTER — Ambulatory Visit
Admission: RE | Admit: 2019-04-07 | Discharge: 2019-04-07 | Disposition: A | Discharge status: Home / Self Care | Payer: Medicare Other | Attending: Internal Medicine | Admitting: Internal Medicine

## 2019-04-07 ENCOUNTER — Ambulatory Visit: Payer: Medicare Other | Admitting: Certified Registered Nurse Anesthetist

## 2019-04-07 ENCOUNTER — Encounter
Admission: RE | Disposition: A | Discharge status: Home / Self Care | Payer: Self-pay | Source: Home / Self Care | Attending: Internal Medicine

## 2019-04-07 ENCOUNTER — Encounter: Payer: Self-pay | Admitting: *Deleted

## 2019-04-07 DIAGNOSIS — Z79899 Other long term (current) drug therapy: Secondary | ICD-10-CM | POA: Diagnosis not present

## 2019-04-07 DIAGNOSIS — K5730 Diverticulosis of large intestine without perforation or abscess without bleeding: Secondary | ICD-10-CM | POA: Insufficient documentation

## 2019-04-07 DIAGNOSIS — I10 Essential (primary) hypertension: Secondary | ICD-10-CM | POA: Insufficient documentation

## 2019-04-07 DIAGNOSIS — Z1211 Encounter for screening for malignant neoplasm of colon: Secondary | ICD-10-CM | POA: Insufficient documentation

## 2019-04-07 DIAGNOSIS — D122 Benign neoplasm of ascending colon: Secondary | ICD-10-CM | POA: Diagnosis not present

## 2019-04-07 HISTORY — PX: COLONOSCOPY WITH PROPOFOL: SHX5780

## 2019-04-07 SURGERY — COLONOSCOPY WITH PROPOFOL
Anesthesia: General

## 2019-04-07 MED ORDER — SODIUM CHLORIDE 0.9 % IV SOLN
INTRAVENOUS | Status: DC
  Administered 2019-04-07: 14:00:00 via INTRAVENOUS

## 2019-04-07 MED ORDER — LIDOCAINE HCL (CARDIAC) PF 100 MG/5ML IV SOSY
PREFILLED_SYRINGE | INTRAVENOUS | Status: DC | PRN
  Administered 2019-04-07: 50 mg via INTRAVENOUS

## 2019-04-07 MED ORDER — PROPOFOL 10 MG/ML IV BOLUS
INTRAVENOUS | Status: DC | PRN
  Administered 2019-04-07: 30 mg via INTRAVENOUS

## 2019-04-07 MED ORDER — PROPOFOL 10 MG/ML IV BOLUS
INTRAVENOUS | Status: AC
  Filled 2019-04-07: qty 20

## 2019-04-07 MED ORDER — PROPOFOL 500 MG/50ML IV EMUL
INTRAVENOUS | Status: AC
  Filled 2019-04-07: qty 50

## 2019-04-07 MED ORDER — PROPOFOL 500 MG/50ML IV EMUL
INTRAVENOUS | Status: DC | PRN
  Administered 2019-04-07: 150 ug/kg/min via INTRAVENOUS

## 2019-04-07 NOTE — Anesthesia Preprocedure Evaluation (Signed)
Anesthesia Evaluation  Patient identified by MRN, date of birth, ID band Patient awake    Reviewed: Allergy & Precautions, NPO status , Patient's Chart, lab work & pertinent test results  History of Anesthesia Complications Negative for: history of anesthetic complications  Airway Mallampati: II       Dental   Pulmonary neg sleep apnea, neg COPD, Not current smoker,           Cardiovascular hypertension, Pt. on medications (-) Past MI and (-) CHF (-) dysrhythmias (-) Valvular Problems/Murmurs     Neuro/Psych neg Seizures    GI/Hepatic Neg liver ROS, neg GERD  ,  Endo/Other  neg diabetes  Renal/GU negative Renal ROS     Musculoskeletal   Abdominal   Peds  Hematology   Anesthesia Other Findings   Reproductive/Obstetrics                             Anesthesia Physical Anesthesia Plan  ASA: II  Anesthesia Plan: General   Post-op Pain Management:    Induction: Intravenous  PONV Risk Score and Plan: 3 and Propofol infusion, TIVA and Ondansetron  Airway Management Planned: Nasal Cannula  Additional Equipment:   Intra-op Plan:   Post-operative Plan:   Informed Consent: I have reviewed the patients History and Physical, chart, labs and discussed the procedure including the risks, benefits and alternatives for the proposed anesthesia with the patient or authorized representative who has indicated his/her understanding and acceptance.       Plan Discussed with:   Anesthesia Plan Comments:         Anesthesia Quick Evaluation

## 2019-04-07 NOTE — Anesthesia Postprocedure Evaluation (Signed)
Anesthesia Post Note  Patient: Taylor Morrow  Procedure(s) Performed: COLONOSCOPY WITH PROPOFOL (N/A )  Patient location during evaluation: Endoscopy Anesthesia Type: General Level of consciousness: awake and alert Pain management: pain level controlled Vital Signs Assessment: post-procedure vital signs reviewed and stable Respiratory status: spontaneous breathing and respiratory function stable Cardiovascular status: stable Anesthetic complications: no     Last Vitals:  Vitals:   04/07/19 1314 04/07/19 1435  BP: (!) 151/79 117/64  Pulse: 62   Resp: 16   Temp: 36.8 C (!) 36.1 C  SpO2: 100%     Last Pain:  Vitals:   04/07/19 1445  TempSrc:   PainSc: 0-No pain                 Taylor Morrow

## 2019-04-07 NOTE — H&P (Signed)
Outpatient short stay form Pre-procedure 04/07/2019 1:44 PM Kyley Laurel K. Alice Reichert, M.D.  Primary Physician: Sander Radon, NP  Reason for visit:  Personal hx of adenomatous polyps x 3 - 10/27/2015 Colonoscopy.  History of present illness:                            Patient presents for colonoscopy for a personal hx of colon polyps. The patient denies abdominal pain, abnormal weight loss or rectal bleeding.     Current Facility-Administered Medications:  .  0.9 %  sodium chloride infusion, , Intravenous, Continuous, Dejanira Pamintuan, Benay Pike, MD  Medications Prior to Admission  Medication Sig Dispense Refill Last Dose  . alendronate (FOSAMAX) 70 MG tablet Take 70 mg by mouth once a week. Take with a full glass of water on an empty stomach.   Past Week at Unknown time  . calcium carbonate (OS-CAL - DOSED IN MG OF ELEMENTAL CALCIUM) 1250 (500 Ca) MG tablet Take 1 tablet by mouth 2 (two) times daily with a meal.   04/06/2019 at Unknown time  . ergocalciferol (VITAMIN D2) 50000 units capsule Take 50,000 Units by mouth once a week.   Past Week at Unknown time  . lisinopril (PRINIVIL,ZESTRIL) 10 MG tablet Take 10 mg by mouth daily.   04/07/2019 at 0730  . magnesium oxide (MAG-OX) 400 MG tablet Take 400 mg by mouth daily.   04/06/2019 at Unknown time  . estradiol (ESTRACE VAGINAL) 0.1 MG/GM vaginal cream Apply 0.5mg  (pea-sized amount)  just inside the vaginal introitus with a finger-tip every night for two weeks and then Monday, Wednesday and Friday nights. (Patient not taking: Reported on 04/07/2019) 30 g 12 Not Taking at Unknown time  . OMEGA 3-6-9 FATTY ACIDS PO Take 1,000 mg by mouth daily.   Not Taking at Unknown time     No Known Allergies   Past Medical History:  Diagnosis Date  . Hypertension   . Vitamin D deficiency     Review of systems:  Otherwise negative.    Physical Exam  Gen: Alert, oriented. Appears stated age.  HEENT: [place]/AT. PERRLA. Lungs: CTA, no wheezes. CV: RR nl S1,  S2. Abd: soft, benign, no masses. BS+ Ext: No edema. Pulses 2+    Planned procedures: Proceed with colonoscopy. The patient understands the nature of the planned procedure, indications, risks, alternatives and potential complications including but not limited to bleeding, infection, perforation, damage to internal organs and possible oversedation/side effects from anesthesia. The patient agrees and gives consent to proceed.  Please refer to procedure notes for findings, recommendations and patient disposition/instructions.     Estreya Clay K. Alice Reichert, M.D. Gastroenterology 04/07/2019  1:44 PM

## 2019-04-07 NOTE — Transfer of Care (Signed)
Immediate Anesthesia Transfer of Care Note  Patient: Taylor Morrow  Procedure(s) Performed: COLONOSCOPY WITH PROPOFOL (N/A )  Patient Location: PACU  Anesthesia Type:General  Level of Consciousness: awake, alert  and oriented  Airway & Oxygen Therapy: Patient Spontanous Breathing and Patient connected to nasal cannula oxygen  Post-op Assessment: Report given to RN and Post -op Vital signs reviewed and stable  Post vital signs: Reviewed and stable  Last Vitals:  Vitals Value Taken Time  BP    Temp 36.1 C 04/07/19 1435  Pulse 59 04/07/19 1435  Resp 27 04/07/19 1435  SpO2 96 % 04/07/19 1435  Vitals shown include unvalidated device data.  Last Pain:  Vitals:   04/07/19 1435  TempSrc: Tympanic  PainSc:          Complications: No apparent anesthesia complications

## 2019-04-07 NOTE — Op Note (Signed)
[facility] Gastroenterology Patient Name: Taylor Morrow Procedure Date: 04/07/2019 2:08 PM MRN: [id number] Account #: [id number] Date of Birth: 1946-06-26 Admit Type: Outpatient Age: 73 Room: [facility] ENDO ROOM 3 Gender: Female Note Status: Finalized Procedure:            Colonoscopy Indications:          High risk colon cancer surveillance: Personal history                        of colonic polyps Providers:            Benay Pike. Alice Reichert MD, MD Referring MD:         Althea Grimmer, NP (Referring MD) Medicines:            Propofol per Anesthesia Complications:        No immediate complications. Procedure:            Pre-Anesthesia Assessment:                       - The risks and benefits of the procedure and the                        sedation options and risks were discussed with the                        patient. All questions were answered and informed                        consent was obtained.                       - Patient identification and proposed procedure were                        verified prior to the procedure by the nurse. The                        procedure was verified in the procedure room.                       - ASA Grade Assessment: III - A patient with severe                        systemic disease.                       - After reviewing the risks and benefits, the patient                        was deemed in satisfactory condition to undergo the                        procedure.                       After obtaining informed consent, the colonoscope was                        passed under direct vision. Throughout the procedure,  the patient's blood pressure, pulse, and oxygen                        saturations were monitored continuously. The                        Colonoscope was introduced through the anus and                        advanced to the the cecum, identified by appendiceal   orifice and ileocecal valve. The colonoscopy was                        performed without difficulty. The patient tolerated the                        procedure well. The quality of the bowel preparation                        was good. The ileocecal valve, appendiceal orifice, and                        rectum were photographed. Findings:      The perianal and digital rectal examinations were normal. Pertinent       negatives include normal sphincter tone and no palpable rectal lesions.      A 6 mm polyp was found in the ascending colon. The polyp was sessile.       The polyp was removed with a jumbo cold forceps. Resection and retrieval       were complete.      Many diverticula were found in the sigmoid colon.      The exam was otherwise without abnormality. Impression:           - One 6 mm polyp in the ascending colon, removed with a                        jumbo cold forceps. Resected and retrieved.                       - Diverticulosis in the sigmoid colon.                       - The examination was otherwise normal. Recommendation:       - Patient has a contact number available for                        emergencies. The signs and symptoms of potential                        delayed complications were discussed with the patient.                        Return to normal activities tomorrow. Written discharge                        instructions were provided to the patient.                       - Resume previous diet.                       -  Continue present medications.                       - Await pathology results.                       - Repeat colonoscopy is recommended for surveillance.                        The colonoscopy date will be determined after pathology                        results from today's exam become available for review.                       - Return to GI office PRN.                       - The findings and recommendations were discussed with                         the patient. Procedure Code(s):    --- Professional ---                       [phone], Colonoscopy, flexible; with biopsy, single or                        multiple Diagnosis Code(s):    --- Professional ---                       K57.30, Diverticulosis of large intestine without                        perforation or abscess without bleeding                       K63.5, Polyp of colon                       Z86.010, Personal history of colonic polyps CPT copyright 2019 [employer] Association. All rights reserved. The codes documented in this report are preliminary and upon coder review may  be revised to meet current compliance requirements. Efrain Sella MD, MD 04/07/2019 2:35:01 PM This report has been signed electronically. Number of Addenda: 0 Note Initiated On: 04/07/2019 2:08 PM Scope Withdrawal Time: 0 hours 7 minutes 19 seconds  Total Procedure Duration: 0 hours 11 minutes 36 seconds  Estimated Blood Loss: Estimated blood loss: none.      [facility]

## 2019-04-07 NOTE — Anesthesia Post-op Follow-up Note (Signed)
Anesthesia QCDR form completed.        

## 2019-04-08 ENCOUNTER — Encounter: Payer: Self-pay | Admitting: Internal Medicine

## 2019-04-09 LAB — SURGICAL PATHOLOGY

## 2021-04-02 ENCOUNTER — Telehealth (HOSPITAL_COMMUNITY): Payer: Self-pay | Admitting: Emergency Medicine

## 2021-04-02 ENCOUNTER — Other Ambulatory Visit: Payer: Self-pay

## 2021-04-02 ENCOUNTER — Encounter (HOSPITAL_COMMUNITY): Payer: Self-pay | Admitting: Emergency Medicine

## 2021-04-02 ENCOUNTER — Emergency Department (HOSPITAL_COMMUNITY): Payer: Medicare Other

## 2021-04-02 ENCOUNTER — Emergency Department (HOSPITAL_COMMUNITY)
Admission: EM | Admit: 2021-04-02 | Discharge: 2021-04-02 | Disposition: A | Discharge status: Home / Self Care | Payer: Medicare Other | Attending: Emergency Medicine | Admitting: Emergency Medicine

## 2021-04-02 DIAGNOSIS — U071 COVID-19: Secondary | ICD-10-CM | POA: Diagnosis not present

## 2021-04-02 DIAGNOSIS — I10 Essential (primary) hypertension: Secondary | ICD-10-CM | POA: Diagnosis not present

## 2021-04-02 DIAGNOSIS — R200 Anesthesia of skin: Secondary | ICD-10-CM | POA: Diagnosis not present

## 2021-04-02 DIAGNOSIS — M545 Low back pain, unspecified: Secondary | ICD-10-CM | POA: Insufficient documentation

## 2021-04-02 DIAGNOSIS — S32110A Nondisplaced Zone I fracture of sacrum, initial encounter for closed fracture: Secondary | ICD-10-CM

## 2021-04-02 DIAGNOSIS — Z79899 Other long term (current) drug therapy: Secondary | ICD-10-CM | POA: Diagnosis not present

## 2021-04-02 DIAGNOSIS — W19XXXA Unspecified fall, initial encounter: Secondary | ICD-10-CM | POA: Diagnosis not present

## 2021-04-02 LAB — CBC WITH DIFFERENTIAL/PLATELET
Abs Immature Granulocytes: 0.02 10*3/uL (ref 0.00–0.07)
Basophils Absolute: 0.1 10*3/uL (ref 0.0–0.1)
Basophils Relative: 1 %
Eosinophils Absolute: 0.1 10*3/uL (ref 0.0–0.5)
Eosinophils Relative: 1 %
HCT: 41.2 % (ref 36.0–46.0)
Hemoglobin: 12.8 g/dL (ref 12.0–15.0)
Immature Granulocytes: 0 %
Lymphocytes Relative: 12 %
Lymphs Abs: 0.7 10*3/uL (ref 0.7–4.0)
MCH: 28.2 pg (ref 26.0–34.0)
MCHC: 31.1 g/dL (ref 30.0–36.0)
MCV: 90.7 fL (ref 80.0–100.0)
Monocytes Absolute: 1 10*3/uL (ref 0.1–1.0)
Monocytes Relative: 17 %
Neutro Abs: 3.9 10*3/uL (ref 1.7–7.7)
Neutrophils Relative %: 69 %
Platelets: 214 10*3/uL (ref 150–400)
RBC: 4.54 MIL/uL (ref 3.87–5.11)
RDW: 14.7 % (ref 11.5–15.5)
WBC: 5.7 10*3/uL (ref 4.0–10.5)
nRBC: 0 % (ref 0.0–0.2)

## 2021-04-02 LAB — BASIC METABOLIC PANEL
Anion gap: 11 (ref 5–15)
BUN: 24 mg/dL — ABNORMAL HIGH (ref 8–23)
CO2: 24 mmol/L (ref 22–32)
Calcium: 9.6 mg/dL (ref 8.9–10.3)
Chloride: 101 mmol/L (ref 98–111)
Creatinine, Ser: 0.88 mg/dL (ref 0.44–1.00)
GFR, Estimated: >60 mL/min (ref >60)
Glucose, Bld: 100 mg/dL — ABNORMAL HIGH (ref 70–99)
Potassium: 4.5 mmol/L (ref 3.5–5.1)
Sodium: 136 mmol/L (ref 135–145)

## 2021-04-02 LAB — URINALYSIS, ROUTINE W REFLEX MICROSCOPIC
Bacteria, UA: NONE SEEN
Bilirubin Urine: NEGATIVE
Glucose, UA: NEGATIVE mg/dL
Ketones, ur: 5 mg/dL — AB
Leukocytes,Ua: NEGATIVE
Nitrite: NEGATIVE
Protein, ur: NEGATIVE mg/dL
Specific Gravity, Urine: 1.01 (ref 1.005–1.030)
pH: 6 (ref 5.0–8.0)

## 2021-04-02 LAB — RESP PANEL BY RT-PCR (FLU A&B, COVID) ARPGX2
Influenza A by PCR: NEGATIVE
Influenza B by PCR: NEGATIVE
SARS Coronavirus 2 by RT PCR: POSITIVE — AB

## 2021-04-02 MED ORDER — OXYCODONE HCL 5 MG PO TABS: 2.5000 mg | ORAL_TABLET | ORAL | 0 refills | Status: DC | PRN

## 2021-04-02 MED ORDER — OXYCODONE HCL 5 MG PO TABS
5.0000 mg | ORAL_TABLET | Freq: Once | ORAL | Status: AC
  Administered 2021-04-02: 5 mg via ORAL
  Filled 2021-04-02: qty 1

## 2021-04-02 NOTE — Evaluation (Signed)
Physical Therapy Evaluation Patient Details Name: Taylor Morrow MRN: [id number] DOB: 1946/04/04 Today's Date: 04/02/2021  History of Present Illness  Pt is a 75 y.o. female who presented 04/02/21 with back pain s/p fall 9 days prior when pt was knocked over by her dog onto her knees. Imaging revealed nondisplaced insufficiency type fractures with vacuum phenomenon in the sacral ala, right more notable than left. PMH: HTN   Clinical Impression  Pt presents with condition above and deficits mentioned below, see PT Problem List. PTA, she was independent without AD and living alone in a house with 1 STE and 2 stairs inside to access the den. Currently, pt is primarily limited in lower extremity strength, hip flexion AROM, and balance by her pain. Pt is able to perform all transfers, gait bouts up to ~40 ft, and navigate x1 stair with bil UE support with as much as min guard assist. She did require minA for bed mobility though, but this may be due to her being on an oddly narrow and tall stretcher. Expect pt will progress well as her pain improves. Educated pt and her daughter on safe assistance/guarding as needed and rearranging/preparing her home to improve ease of access to necessary items until pain improves. Recommending HHPT to maximize her functional mobility independence and safety, but pt may progress to no PT follow-up depending on pain control. Will continue to follow acutely.     Recommendations for follow up therapy are one component of a multi-disciplinary discharge planning process, led by the attending physician.  Recommendations may be updated based on patient status, additional functional criteria and insurance authorization.  Follow Up Recommendations Home health PT;Supervision for mobility/OOB (initially until balance improves)    Equipment Recommendations  None recommended by PT (has all needed equipment)    Recommendations for Other Services       Precautions /  Restrictions Precautions Precautions: Fall Restrictions Weight Bearing Restrictions: No      Mobility  Bed Mobility Overal bed mobility: Needs Assistance Bed Mobility: Supine to Sit;Sit to Supine     Supine to sit: Min assist Sit to supine: Min assist   General bed mobility comments: MinA to elevate trunk to sit up on edge of stretcher and minA to bring legs back onto stretcher with return to supine.    Transfers Overall transfer level: Needs assistance Equipment used: Rolling walker (2 wheeled) Transfers: Sit to/from Stand Sit to Stand: Min guard         General transfer comment: Min guard assist for safety with sit <> stand from stretcher. Increased time but able to rise to stand without assistance or LOB.  Ambulation/Gait Ambulation/Gait assistance: Min guard Gait Distance (Feet): 40 Feet Assistive device: Rolling walker (2 wheeled) Gait Pattern/deviations: Step-through pattern;Decreased stride length;Decreased dorsiflexion - right;Decreased dorsiflexion - left;Decreased weight shift to left;Decreased step length - right;Antalgic Gait velocity: reduced Gait velocity interpretation: <1.31 ft/sec, indicative of household ambulator General Gait Details: Pt with slow, antalgic gait pattern, dragging bil feet. Pt with increased difficulty weight shifting to L due to pain, thereby impacting her R foot clearance and step length. No LOB, min guard assist for safety. Educated pt to shift weight laterally to warm-up for gait.  Stairs Stairs: Yes Stairs assistance: Min guard Stair Management: Two rails;Step to pattern;Forwards;Backwards Number of Stairs: 1 General stair comments: Step stool used to simulate stair and RW over top to simulate bil handrails at home. Extra time and effort with heavy reliance on UEs to ascend forward and  descend backwards 1x. Educated pt to lead up with "good" leg and down with "bad" leg. Pt reporting L leg hurts more than R, thereby making it her  "bad" leg.  Wheelchair Mobility    Modified Rankin (Stroke Patients Only)       Balance Overall balance assessment: Needs assistance Sitting-balance support: No upper extremity supported;Feet supported Sitting balance-Leahy Scale: Fair Sitting balance - Comments: Unable to reach anteriorly to donn socks due to pain but able to sit statically without LOB.   Standing balance support: Bilateral upper extremity supported;No upper extremity supported;During functional activity Standing balance-Leahy Scale: Fair Standing balance comment: Able to stand briefly without UE support but with trunk sway, bil UE support for mobility.                             Pertinent Vitals/Pain Pain Assessment: 0-10 Pain Score: 10-Worst pain ever Pain Location: bil hips/lower back (L>R) Pain Descriptors / Indicators: Grimacing;Guarding;Sharp Pain Intervention(s): Limited activity within patient's tolerance;Monitored during session;Repositioned;Premedicated before session    Home Living Family/patient expects to be discharged to:: Private residence Living Arrangements: Alone Available Help at Discharge: Family;Available PRN/intermittently Type of Home: House Home Access: Stairs to enter Entrance Stairs-Rails:  (handle to pull up on into doorframe) Entrance Stairs-Number of Steps: 1 Home Layout: One level (x2 stepa into den with bil handrails, able to reach both) Home Equipment: Walker - 2 wheels;Shower seat;Grab bars - tub/shower;Other (comment);Bedside commode;Toilet riser (hurrycane) Additional Comments: Kitchen, bathroom, and bedroom are all on one level    Prior Function Level of Independence: Independent         Comments: Pt drives. Pt retired. Since the fall she has been using a RW and intermittently the hurrycane.     Hand Dominance   Dominant Hand: Right    Extremity/Trunk Assessment   Upper Extremity Assessment Upper Extremity Assessment: Overall WFL for tasks  assessed    Lower Extremity Assessment Lower Extremity Assessment: Generalized weakness (weakness and limitations in bil hip AROM primarily due to pain)       Communication   Communication: No difficulties  Cognition Arousal/Alertness: Awake/alert Behavior During Therapy: WFL for tasks assessed/performed Overall Cognitive Status: Within Functional Limits for tasks assessed                                        General Comments General comments (skin integrity, edema, etc.): Educated pt and daughter on supervision the first day or so upon going home to ensure pt safety. Educated them to make house more accessible for pt until pain improves through keeping all necessary items on the level where her bedroom, bathroom, and kitchen are. Educated them to use toilet riser, bedside commode near bed, shower chair, and RW. Educated them to guard pt on stairs until balance improves.    Exercises     Assessment/Plan    PT Assessment Patient needs continued PT services  PT Problem List Decreased strength;Decreased activity tolerance;Decreased balance;Decreased mobility;Pain       PT Treatment Interventions DME instruction;Gait training;Stair training;Functional mobility training;Therapeutic exercise;Therapeutic activities;Balance training;Neuromuscular re-education;Patient/family education    PT Goals (Current goals can be found in the Care Plan section)  Acute Rehab PT Goals Patient Stated Goal: to get her pain down PT Goal Formulation: With patient/family Time For Goal Achievement: 04/09/21 Potential to Achieve Goals: Good    Frequency  Min 4X/week   Barriers to discharge Decreased caregiver support children live nearby but work M-F    Co-evaluation               AM-PAC PT "6 Clicks" Mobility  Outcome Measure Help needed turning from your back to your side while in a flat bed without using bedrails?: A Little Help needed moving from lying on your back to  sitting on the side of a flat bed without using bedrails?: A Little Help needed moving to and from a bed to a chair (including a wheelchair)?: A Little Help needed standing up from a chair using your arms (e.g., wheelchair or bedside chair)?: A Little Help needed to walk in hospital room?: A Little Help needed climbing 3-5 steps with a railing? : A Little 6 Click Score: 18    End of Session Equipment Utilized During Treatment: Gait belt Activity Tolerance: Patient tolerated treatment well;Patient limited by pain Patient left: in bed;with call bell/phone within reach;with family/visitor present Nurse Communication: Mobility status PT Visit Diagnosis: Unsteadiness on feet (R26.81);Other abnormalities of gait and mobility (R26.89);History of falling (Z91.81);Difficulty in walking, not elsewhere classified (R26.2);Pain Pain - Right/Left:  (bil) Pain - part of body: Hip (back)    Time: 7322-0254 PT Time Calculation (min) (ACUTE ONLY): 40 min   Charges:   PT Evaluation $PT Eval Low Complexity: 1 Low PT Treatments $Gait Training: 8-22 mins $Therapeutic Activity: 8-22 mins        Moishe Spice, PT, DPT Acute Rehabilitation Services  Pager: [phone] Office: [phone]   Orvan Falconer 04/02/2021, 5:02 PM

## 2021-04-02 NOTE — ED Notes (Signed)
Pt transported to CT ?

## 2021-04-02 NOTE — ED Provider Notes (Signed)
Emergency Medicine Provider Triage Evaluation Note  Taylor Morrow , a 75 y.o. female  was evaluated in triage.  Pt complains of back pain after a fall.  She had x-rays done on Monday and was sent for a epidural on Thursday.  Her back pain has been worsening and is severe to the point now she is having difficulty walking secondary to pain.  No weakness or numbness to the legs.  No loss of bowel or bladder.  History of prior back surgery.  Review of Systems  Positive:  Negative: See above  Physical Exam  BP 139/81 (BP Location: Right Arm)   Pulse 81   Temp 98.6 F (37 C) (Oral)   Resp 16   Ht 5\' 1"  (1.549 m)   Wt 93 kg   SpO2 100%   BMI 38.73 kg/m  Gen:   Awake, no distress   Resp:  Normal effort  MSK:   Moves extremities without difficulty  Other:  Tenderness along the lumbar spine and over the right and left paralumbar muscles.  Positive straight leg raise on the left.  Medical Decision Making  Medically screening exam initiated at 1:01 PM.  Appropriate orders placed.  Storey Stangeland was informed that the remainder of the evaluation will be completed by another provider, this initial triage assessment does not replace that evaluation, and the importance of remaining in the ED until their evaluation is complete.     Myna Bright [place], PA-C 04/02/21 [place], Ankit, MD 04/03/21 [phone]

## 2021-04-02 NOTE — Discharge Instructions (Signed)
Contact a health care provider if: Your pain becomes worse or is not controlled with medicine. Your bowel movements cause a great deal of discomfort. You are unable to have a bowel movement after 4 days. You have pain during intercourse.

## 2021-04-02 NOTE — Telephone Encounter (Signed)
[facility] did not have the medication. New script sent to [facility]

## 2021-04-02 NOTE — ED Triage Notes (Addendum)
Pt had a dog run into her 9 days ago and it knocked her down to a squatting position.  Didn't fall all the way down.  Felt ok the next day. Last Saturday she started having lower back pain.  Had x-ray done on Monday and sent for epidural injection for R lower back on Thursday.  Lower back pain 10/10 at this time- left side worse.

## 2021-04-02 NOTE — ED Provider Notes (Signed)
[facility] EMERGENCY DEPARTMENT Provider Note   CSN: [id number] Arrival date & time: 04/02/21  1137     History Chief Complaint  Patient presents with   Back Pain    Taylor Morrow is a 75 y.o. female who presents emergency department chief complaint of back injury.  Patient fell 9 days ago after being pushed down by her dog.  She fell onto her knees.  She states that she was sore and her right leg went numb however this resolved.  She has a history of sciatica and 2 previous back surgeries.  She was doing well until the next day when her pain became significantly worse.  She went to her primary care doctor on Monday and got an x-ray but never got a return call.  She followed up with her spine doctor who did epidural shot for her right known right-sided sciatica which improved than pain on that side however she is still having significant pain in her low back radiating into her left gluteal region.  She is having severe difficulty ambulating in her house despite help from family and using a walker due to 10 out of 10 pain whenever she tries to bear weight.  Patient used Aleve and Tylenol at home without relief.  Her daughter let her use her tramadol from a previous foot surgery which improved her pain some but did not help her with her ambulatory status.  Patient lives in an older home with multiple stairs and has been unable able to leave her bedroom to get around.  She denies red flag symptoms of saddle anesthesia loss of bowel or bladder continence.  She has no new weakness in the lower extremities.   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   Trochanteric bursitis, left hip 03/23/2017   Tear of medial cartilage or meniscus of knee, current  03/23/2017   GI bleed 08/05/2015   Rectal bleeding 08/05/2015   Impingement syndrome of right shoulder 10/15/2014   Right supraspinatus tenosynovitis 09/16/2014   Complete tear of right rotator cuff 09/16/2014    Past Surgical History:  Procedure Laterality Date   BACK SURGERY     COLONOSCOPY WITH PROPOFOL N/A 10/27/2015   Procedure: COLONOSCOPY WITH PROPOFOL;  Surgeon: Manya Silvas, MD;  Location: [place];  Service: Endoscopy;  Laterality: N/A;   COLONOSCOPY WITH PROPOFOL N/A 04/07/2019   Procedure: COLONOSCOPY WITH PROPOFOL;  Surgeon: Toledo, Benay Pike, MD;  Location: [facility];  Service: Gastroenterology;  Laterality: N/A;   FOOT SURGERY Bilateral    right 2013  left  2014   HERNIA REPAIR     2007   ROTATOR CUFF REPAIR Right    THYROID SURGERY       OB History   No obstetric history on file.     Family History  Problem Relation Age of Onset   Hypertension Mother    Prostate cancer Neg Hx    Kidney cancer Neg Hx    Bladder Cancer Neg Hx     Social History   Tobacco Use   Smoking status: Never   Smokeless tobacco: Never  Substance Use Topics   Alcohol use: No    Alcohol/week: 0.0 standard drinks   Drug use: No  Home Medications Prior to Admission medications   Medication Sig Start Date End Date Taking? Authorizing Provider  alendronate (FOSAMAX) 70 MG tablet Take 70 mg by mouth once a week. Take with a full glass of water on an empty stomach.    [provider]  calcium carbonate (OS-CAL - DOSED IN MG OF ELEMENTAL CALCIUM) 1250 (500 Ca) MG tablet Take 1 tablet by mouth 2 (two) times daily with a meal.    [provider]  ergocalciferol (VITAMIN D2) 50000 units capsule Take 50,000 Units by mouth once a week.    [provider]  estradiol (ESTRACE VAGINAL) 0.1 MG/GM vaginal cream Apply 0.5mg  (pea-sized amount)  just inside the vaginal introitus with a finger-tip every night for two weeks and then Monday, Wednesday and  Friday nights. Patient not taking: Reported on 04/07/2019 01/16/18   Zara Council A, PA-C  lisinopril (PRINIVIL,ZESTRIL) 10 MG tablet Take 10 mg by mouth daily.    [provider]  magnesium oxide (MAG-OX) 400 MG tablet Take 400 mg by mouth daily.    [provider]  OMEGA 3-6-9 FATTY ACIDS PO Take 1,000 mg by mouth daily.    [provider]    Allergies    Patient has no known allergies.  Review of Systems   Review of Systems  Musculoskeletal:  Positive for back pain.  Ten systems reviewed and are negative for acute change, except as noted in the HPI.   Physical Exam Updated Vital Signs BP 118/72   Pulse 76   Temp 98.6 F (37 C) (Oral)   Resp 14   Ht 5\' 1"  (1.549 m)   Wt 93 kg   SpO2 96%   BMI 38.73 kg/m   Physical Exam Vitals and nursing note reviewed.  Constitutional:      General: She is not in acute distress.    Appearance: She is well-developed. She is not diaphoretic.  HENT:     Head: Normocephalic and atraumatic.     Right Ear: External ear normal.     Left Ear: External ear normal.     Nose: Nose normal.     Mouth/Throat:     Mouth: Mucous membranes are moist.  Eyes:     General: No scleral icterus.    Conjunctiva/sclera: Conjunctivae normal.  Cardiovascular:     Rate and Rhythm: Normal rate and regular rhythm.     Heart sounds: Normal heart sounds. No murmur heard.   No friction rub. No gallop.  Pulmonary:     Effort: Pulmonary effort is normal. No respiratory distress.     Breath sounds: Normal breath sounds.  Abdominal:     General: Bowel sounds are normal. There is no distension.     Palpations: Abdomen is soft. There is no mass.     Tenderness: There is no abdominal tenderness. There is no guarding.  Musculoskeletal:     Cervical back: Normal range of motion.     Comments: Ttp over the sacrum. Normal strength in the lower extremity  Skin:    General: Skin is warm and dry.  Neurological:     Mental Status: She is  alert and oriented to person, place, and time.  Psychiatric:        Behavior: Behavior normal.    ED Results / Procedures / Treatments   Labs (all labs ordered are listed, but only abnormal results are displayed) Labs Reviewed - No data to display  EKG None  Radiology CT Lumbar Spine Wo Contrast  Result Date: 04/02/2021 CLINICAL DATA:  Low back pain. Fall last week. History of lumbar surgery. EXAM: CT LUMBAR SPINE WITHOUT CONTRAST TECHNIQUE: Multidetector CT imaging of the lumbar spine was performed without intravenous contrast administration. Multiplanar CT image reconstructions were also generated. COMPARISON:  Lumbar spine MRI 12/23/2018 FINDINGS: Segmentation: 5 lumbar type vertebrae. Alignment: Mild lumbar levoscoliosis. Facet mediated anterolisthesis L4 on L5 measuring 4 mm, unchanged. Vertebrae: Nondisplaced insufficiency type fractures with vacuum phenomenon in the sacral ala, right more notable than left. No lumbar spine fracture or suspicious osseous lesion. Chronic Schmorl's node involving the T12 superior endplate. Paraspinal and other soft tissues: Postoperative changes at the GE junction. Colonic diverticulosis. Abdominal aortic atherosclerosis without aneurysm. Disc levels: Mild disc space narrowing at L4-5 and severe narrowing at L5-S1. Chronic moderate spinal stenosis and mild right and severe left neural foraminal stenosis at L4-5 due to bulging uncovered disc and severe facet arthrosis. IMPRESSION: 1. Bilateral sacral fractures. 2. 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 3. Aortic Atherosclerosis (ICD10-I70.0). Electronically Signed   By: Logan Bores M.D.   On: 04/02/2021 14:31    Procedures Procedures   Medications Ordered in ED Medications - No data to display  ED Course  I have reviewed the triage vital signs and the nursing notes.  Pertinent labs & imaging results that  were available during my care of the patient were reviewed by me and considered in my medical decision making (see chart for details).    MDM Rules/Calculators/A&P                           75 year old female here with complaint of sacral pain after fall about 9 days ago.  CT images ordered at triage show bilateral insufficiency fractures of the sacrum.  These are stable fractures.  She has no neurologic deficits.  Patient assessed by PT after pain control and is ambulatory.  She has multiple aids for ambulation at home. Pain meds ordered after PDMP review.  I ordered labs which include BMP, CBC urine without significant abnormality.  Patient's COVID test returned positive after discharge. Patient appears otherwise appropriate for discharge with normal oxygen saturations.  She is hemodynamically stable Final Clinical Impression(s) / ED Diagnoses Final diagnoses:  None    Rx / DC Orders ED Discharge Orders     None        Margarita Mail, PA-C 04/02/21 2312    Lajean Saver, MD 04/03/21 2334

## 2021-04-03 ENCOUNTER — Encounter (HOSPITAL_COMMUNITY): Payer: Self-pay | Admitting: Emergency Medicine

## 2021-04-03 ENCOUNTER — Emergency Department (HOSPITAL_COMMUNITY)
Admission: EM | Admit: 2021-04-03 | Discharge: 2021-04-06 | Disposition: A | Discharge status: Home / Self Care | Payer: Medicare Other | Attending: Emergency Medicine | Admitting: Emergency Medicine

## 2021-04-03 ENCOUNTER — Other Ambulatory Visit: Payer: Self-pay

## 2021-04-03 DIAGNOSIS — M545 Low back pain, unspecified: Secondary | ICD-10-CM

## 2021-04-03 DIAGNOSIS — M6281 Muscle weakness (generalized): Secondary | ICD-10-CM | POA: Diagnosis not present

## 2021-04-03 DIAGNOSIS — W1839XA Other fall on same level, initial encounter: Secondary | ICD-10-CM | POA: Diagnosis not present

## 2021-04-03 DIAGNOSIS — R2681 Unsteadiness on feet: Secondary | ICD-10-CM | POA: Diagnosis not present

## 2021-04-03 DIAGNOSIS — I10 Essential (primary) hypertension: Secondary | ICD-10-CM | POA: Insufficient documentation

## 2021-04-03 DIAGNOSIS — Z79899 Other long term (current) drug therapy: Secondary | ICD-10-CM | POA: Diagnosis not present

## 2021-04-03 DIAGNOSIS — S3992XA Unspecified injury of lower back, initial encounter: Secondary | ICD-10-CM | POA: Diagnosis present

## 2021-04-03 DIAGNOSIS — S3210XA Unspecified fracture of sacrum, initial encounter for closed fracture: Secondary | ICD-10-CM | POA: Diagnosis not present

## 2021-04-03 DIAGNOSIS — Y9301 Activity, walking, marching and hiking: Secondary | ICD-10-CM | POA: Diagnosis not present

## 2021-04-03 LAB — URINALYSIS, ROUTINE W REFLEX MICROSCOPIC
Bacteria, UA: NONE SEEN
Bilirubin Urine: NEGATIVE
Glucose, UA: NEGATIVE mg/dL
Ketones, ur: NEGATIVE mg/dL
Leukocytes,Ua: NEGATIVE
Nitrite: NEGATIVE
Protein, ur: NEGATIVE mg/dL
Specific Gravity, Urine: 1.012 (ref 1.005–1.030)
pH: 7 (ref 5.0–8.0)

## 2021-04-03 LAB — CBC
HCT: 39.3 % (ref 36.0–46.0)
Hemoglobin: 12.5 g/dL (ref 12.0–15.0)
MCH: 28.9 pg (ref 26.0–34.0)
MCHC: 31.8 g/dL (ref 30.0–36.0)
MCV: 91 fL (ref 80.0–100.0)
Platelets: 222 10*3/uL (ref 150–400)
RBC: 4.32 MIL/uL (ref 3.87–5.11)
RDW: 14.7 % (ref 11.5–15.5)
WBC: 4.9 10*3/uL (ref 4.0–10.5)
nRBC: 0 % (ref 0.0–0.2)

## 2021-04-03 LAB — COMPREHENSIVE METABOLIC PANEL
ALT: 19 U/L (ref 0–44)
AST: 20 U/L (ref 15–41)
Albumin: 3.6 g/dL (ref 3.5–5.0)
Alkaline Phosphatase: 66 U/L (ref 38–126)
Anion gap: 7 (ref 5–15)
BUN: 26 mg/dL — ABNORMAL HIGH (ref 8–23)
CO2: 27 mmol/L (ref 22–32)
Calcium: 8.9 mg/dL (ref 8.9–10.3)
Chloride: 103 mmol/L (ref 98–111)
Creatinine, Ser: 0.83 mg/dL (ref 0.44–1.00)
GFR, Estimated: >60 mL/min (ref >60)
Glucose, Bld: 135 mg/dL — ABNORMAL HIGH (ref 70–99)
Potassium: 3.9 mmol/L (ref 3.5–5.1)
Sodium: 137 mmol/L (ref 135–145)
Total Bilirubin: 0.7 mg/dL (ref 0.3–1.2)
Total Protein: 6.6 g/dL (ref 6.5–8.1)

## 2021-04-03 MED ORDER — OXYCODONE HCL 5 MG PO TABS
5.0000 mg | ORAL_TABLET | Freq: Four times a day (QID) | ORAL | Status: DC | PRN
Start: 2021-04-03 — End: 2021-04-06
  Administered 2021-04-03 – 2021-04-06 (×9): 5 mg via ORAL
  Filled 2021-04-03 (×9): qty 1

## 2021-04-03 NOTE — ED Triage Notes (Signed)
Pt seen at [facility] yesterday for back pain and released but told if she had any issues to "come back". Pt states she now has pain going down both legs and has lost control of her bladder.

## 2021-04-03 NOTE — ED Provider Notes (Signed)
[facility] EMERGENCY DEPARTMENT Provider Note   CSN: [id number] Arrival date & time: 04/03/21  1934     History Chief Complaint  Patient presents with   Back Pain    Neidra Girvan Bainter is a 75 y.o. female.   Back Pain   75 y/o female - seen yesterday in the ED, for back pain and buttock pain - had CT scan showing that she had bilateral sacral fractures with severe left foraminal stenosis and moderate spinal stenosis at L4-5.  This occurred after she had a fall while walking her dog about 10 days ago.  She reports that over the last 12 hours she has developed increasing pain going down her legs and now is urinating on herself feeling like she cannot get to the bathroom and sometimes not knowing that she is going to urinate until it happens.  She denies numbness or weakness of her legs but the pain that she is having in her back and going down her legs is limiting her ability to get up out of the bed.  She does not have any assistance at home to help her get back and forth to the bathroom.  Of note the patient did have an MRI in June 2020 secondary to progressive lower back pain which showed similar findings to the CT scan yesterday.  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   Trochanteric bursitis, left hip 03/23/2017   Tear of medial cartilage or meniscus of knee, current 03/23/2017   GI bleed 08/05/2015   Rectal bleeding 08/05/2015   Impingement syndrome of right shoulder 10/15/2014   Right supraspinatus tenosynovitis  09/16/2014   Complete tear of right rotator cuff 09/16/2014    Past Surgical History:  Procedure Laterality Date   BACK SURGERY     COLONOSCOPY WITH PROPOFOL N/A 10/27/2015   Procedure: COLONOSCOPY WITH PROPOFOL;  Surgeon: Manya Silvas, MD;  Location: [place];  Service: Endoscopy;  Laterality: N/A;   COLONOSCOPY WITH PROPOFOL N/A 04/07/2019   Procedure: COLONOSCOPY WITH PROPOFOL;  Surgeon: Toledo, Benay Pike, MD;  Location: [facility];  Service: Gastroenterology;  Laterality: N/A;   FOOT SURGERY Bilateral    right 2013  left  2014   HERNIA REPAIR     2007   ROTATOR CUFF REPAIR Right    THYROID SURGERY       OB History   No obstetric history on file.     Family History  Problem Relation Age of Onset   Hypertension Mother    Prostate cancer Neg Hx    Kidney cancer Neg Hx    Bladder Cancer Neg Hx     Social History   Tobacco Use   Smoking status: Never   Smokeless tobacco: Never  Substance Use Topics   Alcohol use: No    Alcohol/week: 0.0 standard drinks   Drug use: No    Home Medications Prior to Admission medications   Medication Sig Start Date End Date Taking? Authorizing Provider  Acetaminophen (TYLENOL ARTHRITIS EXT RELIEF PO) Take 650 mg by mouth every 6 (six) hours as needed (pain).    [provider]  alendronate (FOSAMAX) 70 MG tablet Take 70 mg by mouth once a week. Take with a full glass of water on an empty stomach. On wednesday    [provider]  atorvastatin (LIPITOR) 10 MG tablet Take 10 mg by mouth daily. 03/23/21   [provider]  calcium carbonate (OS-CAL - DOSED IN MG OF ELEMENTAL CALCIUM) 1250 (500 Ca) MG tablet Take 1 tablet by mouth 2 (two) times daily with a meal.    [provider]  ergocalciferol (VITAMIN D2) 50000 units capsule Take 50,000 Units by mouth once a week. saturdays    [provider]  estradiol (ESTRACE VAGINAL) 0.1 MG/GM vaginal cream Apply 0.5mg  (pea-sized amount)  just  inside the vaginal introitus with a finger-tip every night for two weeks and then Monday, Wednesday and Friday nights. Patient not taking: No sig reported 01/16/18   Zara Council A, PA-C  lisinopril (PRINIVIL,ZESTRIL) 10 MG tablet Take 10 mg by mouth daily.    [provider]  magnesium oxide (MAG-OX) 400 MG tablet Take 400 mg by mouth daily.    [provider]  naproxen sodium (ALEVE) 220 MG tablet Take 220 mg by mouth 2 (two) times daily as needed (pain).    [provider]  oxyCODONE (ROXICODONE) 5 MG immediate release tablet Take 0.5-1 tablets (2.5-5 mg total) by mouth every 4 (four) hours as needed for severe pain. 04/02/21   Harris, Vernie Shanks, PA-C  traMADol (ULTRAM) 50 MG tablet Take 50 mg by mouth every 6 (six) hours as needed for moderate pain.    [provider]    Allergies    Patient has no known allergies.  Review of Systems   Review of Systems  Musculoskeletal:  Positive for back pain.  All other systems reviewed and are negative.  Physical Exam Updated Vital Signs BP (!) 137/107 (BP Location: Right Arm)   Pulse 89   Temp 98.5 F (36.9 C) (Oral)   Resp 16   Ht 1.549 m (5\' 1" ) Comment: Simultaneous filing. User may not have seen previous data.  Wt 93 kg Comment: Simultaneous filing. User may not have seen previous data.  SpO2 95%   BMI 38.73 kg/m   Physical Exam Vitals and nursing note reviewed.  Constitutional:      General: She is not in acute distress.    Appearance: She is well-developed.  HENT:     Head: Normocephalic and atraumatic.     Mouth/Throat:     Pharynx: No oropharyngeal exudate.  Eyes:     General: No scleral icterus.       Right eye: No discharge.        Left eye: No discharge.     Conjunctiva/sclera: Conjunctivae normal.     Pupils: Pupils are equal, round, and reactive to light.  Neck:     Thyroid: No thyromegaly.     Vascular: No JVD.  Cardiovascular:     Rate and Rhythm: Normal rate and regular  rhythm.     Heart sounds: Normal heart sounds. No murmur heard.   No friction rub. No gallop.  Pulmonary:     Effort: Pulmonary effort is normal. No respiratory distress.     Breath sounds: Normal breath sounds. No wheezing or rales.  Abdominal:     General: Bowel sounds are normal. There is no distension.  Palpations: Abdomen is soft. There is no mass.     Tenderness: There is no abdominal tenderness.  Musculoskeletal:        General: Tenderness present. Normal range of motion.     Cervical back: Normal range of motion and neck supple.     Comments: Tenderness across the lower back bilaterally and midline  Lymphadenopathy:     Cervical: No cervical adenopathy.  Skin:    General: Skin is warm and dry.     Findings: No erythema or rash.  Neurological:     Mental Status: She is alert.     Coordination: Coordination normal.     Comments: The patient is able to straight leg raise bilaterally, this causes significant pain but she is able to do it, there is no numbness to light touch or pinprick in the bilateral lower extremities.  With a chaperone present the patient had a genitourinary inspection exam and there is absolutely no numbness around the perineal area.  Psychiatric:        Behavior: Behavior normal.    ED Results / Procedures / Treatments   Labs (all labs ordered are listed, but only abnormal results are displayed) Labs Reviewed - No data to display  EKG None  Radiology CT Lumbar Spine Wo Contrast  Result Date: 04/02/2021 CLINICAL DATA:  Low back pain. Fall last week. History of lumbar surgery. EXAM: CT LUMBAR SPINE WITHOUT CONTRAST TECHNIQUE: Multidetector CT imaging of the lumbar spine was performed without intravenous contrast administration. Multiplanar CT image reconstructions were also generated. COMPARISON:  Lumbar spine MRI 12/23/2018 FINDINGS: Segmentation: 5 lumbar type vertebrae. Alignment: Mild lumbar levoscoliosis. Facet mediated anterolisthesis L4 on L5  measuring 4 mm, unchanged. Vertebrae: Nondisplaced insufficiency type fractures with vacuum phenomenon in the sacral ala, right more notable than left. No lumbar spine fracture or suspicious osseous lesion. Chronic Schmorl's node involving the T12 superior endplate. Paraspinal and other soft tissues: Postoperative changes at the GE junction. Colonic diverticulosis. Abdominal aortic atherosclerosis without aneurysm. Disc levels: Mild disc space narrowing at L4-5 and severe narrowing at L5-S1. Chronic moderate spinal stenosis and mild right and severe left neural foraminal stenosis at L4-5 due to bulging uncovered disc and severe facet arthrosis. IMPRESSION: 1. Bilateral sacral fractures. 2. 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 3. Aortic Atherosclerosis (ICD10-I70.0). Electronically Signed   By: Logan Bores M.D.   On: 04/02/2021 14:31    Procedures Procedures   Medications Ordered in ED Medications - No data to display  ED Course  I have reviewed the triage vital signs and the nursing notes.  Pertinent labs & imaging results that were available during my care of the patient were reviewed by me and considered in my medical decision making (see chart for details).    MDM Rules/Calculators/A&P                           The patient has no obvious neurologic deficits on my exam however seems to be losing urinary continence has had a recent spinal injection and has had recent fractures of her sacrum.  I think she needs an MRI, the problem is that there is no MRI available at this time.  I think it is reasonable to order one for the morning.  By the time she was transferred to [facility] with the holding at that location she would not get the MRI  until morning anyway.  Given that she has no focal deficits I think this can be done in the morning.  The patient will likely need to be placed in a rehab  facility given her inability to walk, will order physical therapy and transition of care consultations for that time.  At change of shift - care signed out to Dr. Wyvonnia Dusky - pt will need MRI and PT / TOC consults in the AM.  Final Clinical Impression(s) / ED Diagnoses Final diagnoses:  Closed fracture of sacrum, unspecified portion of sacrum, initial encounter [facility])     Noemi Chapel, MD 04/04/21 1650

## 2021-04-04 ENCOUNTER — Emergency Department (HOSPITAL_COMMUNITY): Payer: Medicare Other

## 2021-04-04 DIAGNOSIS — S3210XA Unspecified fracture of sacrum, initial encounter for closed fracture: Secondary | ICD-10-CM | POA: Diagnosis not present

## 2021-04-04 MED ORDER — GADOBUTROL 1 MMOL/ML IV SOLN
10.0000 mL | Freq: Once | INTRAVENOUS | Status: AC | PRN
  Administered 2021-04-04: 10 mL via INTRAVENOUS

## 2021-04-04 NOTE — Plan of Care (Signed)
  Problem: Acute Rehab PT Goals(only PT should resolve) Goal: Pt Will Go Supine/Side To Sit Outcome: Progressing Flowsheets (Taken 04/04/2021 1232) Pt will go Supine/Side to Sit:  with supervision  with modified independence Goal: Patient Will Perform Sitting Balance Outcome: Progressing Flowsheets (Taken 04/04/2021 1232) Patient will perform sitting balance:  with modified independence  with supervision Goal: Patient Will Transfer Sit To/From Stand Outcome: Progressing Flowsheets (Taken 04/04/2021 1232) Patient will transfer sit to/from stand:  with modified independence  with supervision Goal: Pt Will Ambulate Outcome: Progressing Flowsheets (Taken 04/04/2021 1232) Pt will Ambulate:  50 feet  with supervision  with min guard assist  with rolling walker   12:33 PM, 04/04/21 Lonell Grandchild, MPT Physical Therapist with [facility] 336 [phone] office [phone] mobile phone

## 2021-04-04 NOTE — ED Notes (Signed)
Pt transported to MRI 

## 2021-04-04 NOTE — ED Provider Notes (Signed)
IMPRESSION:  Sacral insufficiency fractures, crossing at the S2 level.     Moderate multifactorial stenosis at the L4-5 level that would likely  worsen with standing or flexion. Facet arthropathy with fluid-filled  joints. 3 mm of anterolisthesis. Bulging of the disc. Foraminal  stenosis on the left that could additionally compress the L4 nerve.  MRI findings show sacral insufficiency fractures.  Multifactorial stenosis.  No findings that require operative intervention   Dorie Rank, MD 04/04/21 1251

## 2021-04-04 NOTE — Progress Notes (Addendum)
CSW updated by Jackelyn Poling with [facility] who states that they will be able to accept pt. They will not be able to have pt arrive to the facility until around lunch tomorrow. CSW to follow up in the morning with Debbie. CSW updated pts daughter that pt has been accepted at [facility] and should transition to their facility tomorrow afternoon.

## 2021-04-04 NOTE — NC FL2 (Signed)
[place] LEVEL OF CARE SCREENING TOOL     IDENTIFICATION  Patient Name: Taylor Morrow Birthdate: 1945-11-03 Sex: female Admission Date (Current Location): 04/03/2021  [facility] and [place] Number:  [employer] and Address:  [facility] [address] [place]      Provider Number: [id number]  Attending Physician Name and Address:  Default, Provider, MD  Relative Name and Phone Number:       Current Level of Care: Hospital Recommended Level of Care: [place] Prior Approval Number:    Date Approved/Denied:   PASRR Number: [id number]  Discharge Plan: SNF    Current Diagnoses: Patient Active Problem List   Diagnosis Date Noted   Lumbar spondylosis 01/04/2018   Fatty liver disease, nonalcoholic 39/76/7341   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 User may not have seen previous data.) Height:  5\' 1"  (154.9 cm) (Simultaneous filing. User may not have seen previous data.)  BEHAVIORAL SYMPTOMS/MOOD NEUROLOGICAL BOWEL NUTRITION STATUS      Continent Diet  AMBULATORY STATUS COMMUNICATION OF NEEDS Skin   Extensive Assist Verbally Normal                       Personal Care Assistance Level of Assistance  Dressing, Feeding, Bathing, Total care Bathing Assistance: Limited assistance Feeding assistance: Independent Dressing Assistance: Limited assistance Total Care Assistance: Limited assistance   Functional Limitations Info  Sight,  Speech, Hearing Sight Info: Adequate Hearing Info: Adequate Speech Info: Adequate    SPECIAL CARE FACTORS FREQUENCY  PT (By licensed PT), OT (By licensed OT)     PT Frequency: 5 times weekly OT Frequency: 5 times weekly            Contractures Contractures Info: Not present    Additional Factors Info  Code Status, Allergies Code Status Info: FULL Allergies Info: NKA           Current Medications (04/04/2021):  This is the current hospital active medication list Current Facility-Administered Medications  Medication Dose Route Frequency Provider Last Rate Last Admin   oxyCODONE (Oxy IR/ROXICODONE) immediate release tablet 5 mg  5 mg Oral Q6H PRN Noemi Chapel, MD   5 mg at 04/04/21 1034   Current Outpatient Medications  Medication Sig Dispense Refill   Acetaminophen (TYLENOL ARTHRITIS EXT RELIEF PO) Take 650 mg by mouth every 6 (six) hours as needed (pain).     alendronate (FOSAMAX) 70 MG tablet Take 70 mg by mouth once a week. Take with a full glass of water on an empty stomach. On wednesday     atorvastatin (LIPITOR) 10 MG tablet Take 10 mg by mouth daily.     calcium carbonate (OS-CAL - DOSED IN MG OF ELEMENTAL CALCIUM) 1250 (500 Ca) MG tablet Take 1 tablet by mouth 2 (two) times daily with a meal.     cephALEXin (KEFLEX) 250 MG capsule Take 250 mg by mouth daily.     ergocalciferol (VITAMIN D2) 50000 units capsule Take 50,000 Units by mouth  once a week. saturdays     lisinopril (PRINIVIL,ZESTRIL) 10 MG tablet Take 10 mg by mouth daily.     magnesium oxide (MAG-OX) 400 MG tablet Take 400 mg by mouth daily.     naproxen sodium (ALEVE) 220 MG tablet Take 220 mg by mouth 2 (two) times daily as needed (pain).     oxyCODONE (ROXICODONE) 5 MG immediate release tablet Take 0.5-1 tablets (2.5-5 mg total) by mouth every 4 (four) hours as needed for severe pain. 20 tablet 0   traMADol (ULTRAM) 50 MG tablet Take 50 mg by mouth every 6 (six) hours as needed for moderate pain.      estradiol (ESTRACE VAGINAL) 0.1 MG/GM vaginal cream Apply 0.5mg  (pea-sized amount)  just inside the vaginal introitus with a finger-tip every night for two weeks and then Monday, Wednesday and Friday nights. (Patient not taking: No sig reported) 30 g 12     Discharge Medications: Please see discharge summary for a list of discharge medications.  Relevant Imaging Results:  Relevant Lab Results:   Additional Information SSN: [id number], [place]

## 2021-04-04 NOTE — Progress Notes (Signed)
CSW spoke with pt and daughter about bed offers. They were interested in [facility]. However, after further review of chart pt was found COVID+ 10/1 so pt cannot go to [facility]. CSW reached out to [place] with [facility] as they are able to take COVID+ pts on a case by case basis. She is not in the building but will let CSW know when she returns if there is any availability. TOC to follow.

## 2021-04-04 NOTE — Evaluation (Signed)
Physical Therapy Evaluation Patient Details Name: Taylor Morrow MRN: [id number] DOB: 11-29-1945 Today's Date: 04/04/2021  History of Present Illness  Taylor Morrow. Pulido is a 75 y/o female seen at [facility] yesterday for back pain and released but told if she had any issues to "come back". Pt states she now has pain going down both legs and has lost control of her bladder.   Clinical Impression  Patient demonstrates slow labored movement for sitting up at bedside requiring rest breaks, unsteady on feet and limited to a few steps before having to sit due to c/o increased low back/sacral pain with radiation down back of legs to ankles.  Patient very uncomfortable in chair having to change position frequently due to discomfort in back/sacral area.  Patient tolerated sitting up in chair after therapy - nursing staff notified.  Patient will benefit from continued physical therapy in hospital and recommended venue below to increase strength, balance, endurance for safe ADLs and gait.          Recommendations for follow up therapy are one component of a multi-disciplinary discharge planning process, led by the attending physician.  Recommendations may be updated based on patient status, additional functional criteria and insurance authorization.  Follow Up Recommendations SNF;Supervision for mobility/OOB;Supervision - Intermittent    Equipment Recommendations  None recommended by PT    Recommendations for Other Services       Precautions / Restrictions Precautions Precautions: Fall Restrictions Weight Bearing Restrictions: No      Mobility  Bed Mobility Overal bed mobility: Needs Assistance Bed Mobility: Supine to Sit     Supine to sit: Min assist     General bed mobility comments: increase time, labored movement    Transfers Overall transfer level: Needs assistance Equipment used: Rolling walker (2 wheeled) Transfers: Sit to/from [facility] Sit to Stand:  Min guard;Min assist Stand pivot transfers: Min guard;Min assist       General transfer comment: slow labored movement with c/o increased low back/sacral pain  Ambulation/Gait Ambulation/Gait assistance: Min assist Gait Distance (Feet): 7 Feet Assistive device: Rolling walker (2 wheeled) Gait Pattern/deviations: Decreased step length - right;Decreased step length - left;Decreased stride length Gait velocity: slow   General Gait Details: limited to a few steps at bedside before having to sit due to c/o increased low back/sacral pain/discomfort  Stairs            Wheelchair Mobility    Modified Rankin (Stroke Patients Only)       Balance Overall balance assessment: Needs assistance Sitting-balance support: Feet supported;No upper extremity supported Sitting balance-Leahy Scale: Fair Sitting balance - Comments: seated at EOB   Standing balance support: During functional activity;Bilateral upper extremity supported Standing balance-Leahy Scale: Fair Standing balance comment: using RW                             Pertinent Vitals/Pain Pain Assessment: Faces Faces Pain Scale: Hurts even more Pain Location: low back with radiation behind legs to ankles Pain Descriptors / Indicators: Radiating;Guarding;Grimacing Pain Intervention(s): Limited activity within patient's tolerance;Monitored during session;Repositioned    Home Living Family/patient expects to be discharged to:: Private residence Living Arrangements: Alone Available Help at Discharge: Family;Available PRN/intermittently Type of Home: House Home Access: Stairs to enter Entrance Stairs-Rails: None Entrance Stairs-Number of Steps: 1 Home Layout: One level;Two level;Other (Comment) Home Equipment: Walker - 2 wheels;Shower seat;Grab bars - tub/shower;Other (comment);Bedside commode;Toilet riser Additional Comments: Kitchen, bathroom, and  bedroom are all on one level    Prior Function Level of  Independence: Independent         Comments: Pt drives. Pt retired. Since the fall she has been using a RW and intermittently the hurrycane.     Hand Dominance   Dominant Hand: Right    Extremity/Trunk Assessment   Upper Extremity Assessment Upper Extremity Assessment: Overall WFL for tasks assessed    Lower Extremity Assessment Lower Extremity Assessment: Generalized weakness       Communication   Communication: No difficulties  Cognition Arousal/Alertness: Awake/alert Behavior During Therapy: WFL for tasks assessed/performed Overall Cognitive Status: Within Functional Limits for tasks assessed                                        General Comments      Exercises     Assessment/Plan    PT Assessment Patient needs continued PT services  PT Problem List Decreased strength;Decreased activity tolerance;Decreased balance;Decreased mobility       PT Treatment Interventions DME instruction;Gait training;Stair training;Functional mobility training;Therapeutic activities;Therapeutic exercise;Balance training;Patient/family education    PT Goals (Current goals can be found in the Care Plan section)  Acute Rehab PT Goals Patient Stated Goal: return home without pain PT Goal Formulation: With patient Time For Goal Achievement: 04/18/21 Potential to Achieve Goals: Good    Frequency Min 2X/week   Barriers to discharge        Co-evaluation               AM-PAC PT "6 Clicks" Mobility  Outcome Measure Help needed turning from your back to your side while in a flat bed without using bedrails?: A Little Help needed moving from lying on your back to sitting on the side of a flat bed without using bedrails?: A Little Help needed moving to and from a bed to a chair (including a wheelchair)?: A Little Help needed standing up from a chair using your arms (e.g., wheelchair or bedside chair)?: A Little Help needed to walk in hospital room?: A  Little Help needed climbing 3-5 steps with a railing? : A Little 6 Click Score: 18    End of Session   Activity Tolerance: Patient tolerated treatment well;Patient limited by fatigue;Patient limited by pain Patient left: in chair;with call bell/phone within reach Nurse Communication: Mobility status PT Visit Diagnosis: Unsteadiness on feet (R26.81);Other abnormalities of gait and mobility (R26.89);Muscle weakness (generalized) (M62.81);Pain Pain - part of body:  (low back/sacrum with radiation behind legs to ankles)    Time: 4818-5631 PT Time Calculation (min) (ACUTE ONLY): 23 min   Charges:   PT Evaluation $PT Eval Moderate Complexity: 1 Mod PT Treatments $Therapeutic Activity: 23-37 mins        12:30 PM, 04/04/21 Lonell Grandchild, MPT Physical Therapist with [facility] [phone] office [phone] mobile phone

## 2021-04-04 NOTE — ED Notes (Signed)
Pt received a head to toe bed bath, Bottom sheet blankets, pillow cases, gown, and flat sheets were all changed. A new purwick was placed on pt @ 11:50am. Pt was positioned comfortably  with no further needs.

## 2021-04-04 NOTE — ED Notes (Signed)
Meal tray given to patient.

## 2021-04-05 DIAGNOSIS — S3210XA Unspecified fracture of sacrum, initial encounter for closed fracture: Secondary | ICD-10-CM | POA: Diagnosis not present

## 2021-04-05 MED ORDER — CEPHALEXIN 250 MG PO CAPS
250.0000 mg | ORAL_CAPSULE | Freq: Every day | ORAL | Status: DC
  Administered 2021-04-05 – 2021-04-06 (×2): 250 mg via ORAL
  Filled 2021-04-05 (×5): qty 1

## 2021-04-05 MED ORDER — ATORVASTATIN CALCIUM 10 MG PO TABS
10.0000 mg | ORAL_TABLET | Freq: Every day | ORAL | Status: DC
  Administered 2021-04-05 – 2021-04-06 (×2): 10 mg via ORAL
  Filled 2021-04-05 (×2): qty 1

## 2021-04-05 MED ORDER — TRAMADOL HCL 50 MG PO TABS
50.0000 mg | ORAL_TABLET | Freq: Four times a day (QID) | ORAL | Status: DC | PRN
Start: 2021-04-05 — End: 2021-04-06

## 2021-04-05 MED ORDER — LISINOPRIL 10 MG PO TABS
10.0000 mg | ORAL_TABLET | Freq: Every day | ORAL | Status: DC
  Administered 2021-04-05 – 2021-04-06 (×2): 10 mg via ORAL
  Filled 2021-04-05 (×2): qty 1

## 2021-04-05 MED ORDER — OXYCODONE HCL 5 MG PO TABS: 2.5000 mg | ORAL_TABLET | ORAL | Status: DC | PRN

## 2021-04-05 MED ORDER — CALCIUM CARBONATE 1250 (500 CA) MG PO TABS
1.0000 | ORAL_TABLET | Freq: Two times a day (BID) | ORAL | Status: DC
  Administered 2021-04-06: 500 mg via ORAL
  Filled 2021-04-05 (×5): qty 1

## 2021-04-05 MED ORDER — NAPROXEN SODIUM 275 MG PO TABS
275.0000 mg | ORAL_TABLET | Freq: Two times a day (BID) | ORAL | Status: DC | PRN
  Filled 2021-04-05: qty 1

## 2021-04-05 MED ORDER — MAGNESIUM OXIDE -MG SUPPLEMENT 400 (240 MG) MG PO TABS
400.0000 mg | ORAL_TABLET | Freq: Every day | ORAL | Status: DC
  Administered 2021-04-06: 400 mg via ORAL
  Filled 2021-04-05 (×4): qty 1

## 2021-04-05 NOTE — ED Notes (Signed)
Patient updated on bed status at this time. Patient requesting to be reevaluated by PT in the am and see if able to be cared for at home.

## 2021-04-05 NOTE — ED Notes (Signed)
Patient's daughter at bedside.

## 2021-04-05 NOTE — ED Notes (Signed)
Spoke to [place] at [facility] regarding bed for Mrs. Cathell.  She states they do not have her on the list of admissions. She checked with her DON and calling CSW Jackelyn Poling to check. She also states their bathrooms are semi private. States she will call back and let oncoming AC know bed status.

## 2021-04-05 NOTE — Progress Notes (Signed)
CSW awaiting update from [place] with [facility] on when room will be ready for pt. TOC to follow.

## 2021-04-05 NOTE — ED Notes (Signed)
Pt alert and verbal. Pleasant mood, requested saltine crackers with pain meds.

## 2021-04-05 NOTE — Progress Notes (Signed)
CSW spoke to [place] with [facility], they are still working on moving other pts around. The delay is due to pt being COVID+ and will need a private room and bathroom. CSW will update ED staff as soon as pt has a bed ready. CSW also updated pts daughter of this. TOC to follow.

## 2021-04-06 DIAGNOSIS — S3210XA Unspecified fracture of sacrum, initial encounter for closed fracture: Secondary | ICD-10-CM | POA: Diagnosis not present

## 2021-04-06 NOTE — ED Notes (Signed)
CSW updated that facility was called last night and staff stated they did not have pt on list for admission. CSW spoke to [place] in admissions this morning who states that pt was on list and they were working on getting beds rearranged. CSW confirmed that [facility] can accept pt today at 27. CSW updated all ED staff of this information. ED to arrange transportation. CSW updated pts daughter of plan for transfer to [facility]. TOC to follow.

## 2021-04-06 NOTE — Discharge Instructions (Addendum)
It was our pleasure to provide your ER care today - we hope that you feel better.  Avoid bending at waist, or strain to back. Try heat therapy for symptom relief. Take your meds/pain meds as need, as prescribed.   Follow up with primary care doctor in 1-2 weeks.   Return to ER if worse, new symptoms, high fevers, chest pain, trouble breathing, or other concern.

## 2021-04-06 NOTE — ED Notes (Signed)
Pharm called for OS-CAL, stated it would be dropped off soon.

## 2021-04-06 NOTE — ED Provider Notes (Signed)
Pt has been awaiting SNF placement.  TOC indicates pt has bed at [facility].  Vitals normal, nad.   Pt currently appears stable for d/c  to SNF.   Lajean Saver, MD 04/06/21 1032

## 2022-06-29 ENCOUNTER — Ambulatory Visit (INDEPENDENT_AMBULATORY_CARE_PROVIDER_SITE_OTHER): Payer: Medicare Other | Admitting: Neurosurgery

## 2022-06-29 ENCOUNTER — Encounter: Payer: Self-pay | Admitting: Neurosurgery

## 2022-06-29 VITALS — BP 128/82 | Ht 60.0 in | Wt 205.0 lb

## 2022-06-29 DIAGNOSIS — M4316 Spondylolisthesis, lumbar region: Secondary | ICD-10-CM

## 2022-06-29 DIAGNOSIS — M5416 Radiculopathy, lumbar region: Secondary | ICD-10-CM | POA: Diagnosis not present

## 2022-06-29 NOTE — Progress Notes (Signed)
Referring Physician:  Harvest Dark, [place],  [place] [address]  Primary Physician:  Sander Radon, NP  History of Present Illness: 06/29/2022 [name] is a 76 y.o with a history of obesity, current BMI of 40, hyperlipidemia, osteoporosis on Fosamax, hypertension, and chronic lumbosacral complaints who here today for further evaluation of low back pain that radiates into her left buttock and down the posterior lateral aspect of her left leg and into her foot.  She states is been going on for several years however is worsened over the last year making it difficult for her to participate in her daily activities.  She states she is only able to stand or walk for several minutes before having to sit down and rest.  It makes it difficult for her to do her dishes as she does not have a dishwasher.  She denies any similar right-sided symptoms although she has had these in the past.  Despite having undergone multiple injections, her symptoms continue to persist and are affecting her quality of life.  Conservative measures:  Physical therapy: None within the last year Multimodal medical therapy including regular antiinflammatories: Aleve, tramadol, and acetaminophen  Injections:  06/20/2022: Left L5-S1 transforaminal ESI (dexamethasone 13 mg) 05/01/2022: Left L5-S1 transforaminal ESI (65% relief, dexamethasone 10 mg) 01/06/2022: Left L5-S1 transforaminal ESI (60% relief) 03/31/2021: Right L5-S1 transforaminal ESI (90% relief) 07/21/2020: Right L5-S1 transforaminal ESI (good relief) 12/24/2018: Right L5-S1 transforaminal ESI (good relief) 11/20/2018: Right S1 transforaminal ESI (moderate to good relief x2 weeks) 04/02/2018: Right S1 transforaminal ESI (good relief) 02/01/2018: Right S1 transforaminal ESI (good relief of lower extremity pain)   Past Surgery: previous lumbar surgeries ~30 years ago  Taylor Morrow has no symptoms of cervical  myelopathy.  The symptoms are causing a significant impact on the patient's life.   Review of Systems:  A 10 point review of systems is negative, except for the pertinent positives and negatives detailed in the HPI.  Past Medical History: Past Medical History:  Diagnosis Date   Hypertension    Vitamin D deficiency     Past Surgical History: Past Surgical History:  Procedure Laterality Date   BACK SURGERY     COLONOSCOPY WITH PROPOFOL N/A 10/27/2015   Procedure: COLONOSCOPY WITH PROPOFOL;  Surgeon: Manya Silvas, MD;  Location: [facility];  Service: Endoscopy;  Laterality: N/A;   COLONOSCOPY WITH PROPOFOL N/A 04/07/2019   Procedure: COLONOSCOPY WITH PROPOFOL;  Surgeon: Toledo, Benay Pike, MD;  Location: [facility];  Service: Gastroenterology;  Laterality: N/A;   FOOT SURGERY Bilateral    right 2013  left  2014   HERNIA REPAIR     2007   ROTATOR CUFF REPAIR Right    THYROID SURGERY      Allergies: Allergies as of 06/29/2022   (No Known Allergies)    Medications: Outpatient Encounter Medications as of 06/29/2022  Medication Sig   Acetaminophen (TYLENOL ARTHRITIS EXT RELIEF PO) Take 650 mg by mouth every 6 (six) hours as needed (pain).   alendronate (FOSAMAX) 70 MG tablet Take 70 mg by mouth once a week. Take with a full glass of water on an empty stomach. On wednesday   atorvastatin (LIPITOR) 10 MG tablet Take 10 mg by mouth daily.   calcium carbonate (OS-CAL - DOSED IN MG OF ELEMENTAL CALCIUM) 1250 (500 Ca) MG tablet Take 1 tablet by mouth 2 (two) times daily with a meal.   cephALEXin (KEFLEX) 250 MG capsule Take 250  mg by mouth daily.   ergocalciferol (VITAMIN D2) 50000 units capsule Take 50,000 Units by mouth once a week. saturdays   estradiol (ESTRACE VAGINAL) 0.1 MG/GM vaginal cream Apply 0.'5mg'$  (pea-sized amount)  just inside the vaginal introitus with a finger-tip every night for two weeks and then Monday, Wednesday and Friday nights. (Patient not taking: No sig  reported)   lisinopril (PRINIVIL,ZESTRIL) 10 MG tablet Take 10 mg by mouth daily.   magnesium oxide (MAG-OX) 400 MG tablet Take 400 mg by mouth daily.   naproxen sodium (ALEVE) 220 MG tablet Take 220 mg by mouth 2 (two) times daily as needed (pain).   oxyCODONE (ROXICODONE) 5 MG immediate release tablet Take 0.5-1 tablets (2.5-5 mg total) by mouth every 4 (four) hours as needed for severe pain.   traMADol (ULTRAM) 50 MG tablet Take 50 mg by mouth every 6 (six) hours as needed for moderate pain.   No facility-administered encounter medications on file as of 06/29/2022.    Social History: Social History   Tobacco Use   Smoking status: Never   Smokeless tobacco: Never  Substance Use Topics   Alcohol use: No    Alcohol/week: 0.0 standard drinks of alcohol   Drug use: No    Family Medical History: Family History  Problem Relation Age of Onset   Hypertension Mother    Prostate cancer Neg Hx    Kidney cancer Neg Hx    Bladder Cancer Neg Hx     Physical Examination: '@VITALWITHPAIN'$ @  General: Patient is well developed, well nourished, calm, collected, and in no apparent distress. Attention to examination is appropriate.  Psychiatric: Patient is non-anxious.  Head:  Pupils equal, round, and reactive to light.  ENT:  Oral mucosa appears well hydrated.  Neck:   Supple.  Full range of motion.  Respiratory: Patient is breathing without any difficulty.  Extremities: No edema.  Vascular: Palpable dorsal pedal pulses.  Skin:   On exposed skin, there are no abnormal skin lesions.  NEUROLOGICAL:     Awake, alert, oriented to person, place, and time.  Speech is clear and fluent. Fund of knowledge is appropriate.   Cranial Nerves: Grossly intact ROM of spine: Limited flexion extension secondary to pain palpation of spine: non tender.    Strength: Side Biceps Triceps Deltoid Interossei Grip Wrist Ext. Wrist Flex.  R '5 5 5 5 5 5 5  '$ L '5 5 5 5 5 5 5   '$ Side Iliopsoas Quads  Hamstring PF DF EHL  R '5 5 5 5 5 5  '$ L 5 4- '5 5 5 5   '$ Reflexes are 1+ and symmetric at the biceps, triceps, brachioradialis, patella and achilles.   Hoffman's is absent.  Clonus is not present.  Toes are down-going.  Bilateral upper and lower extremity sensation is intact to light touch.     Medical Decision Making  Imaging: MRI L spine 04/04/21 IMPRESSION: Sacral insufficiency fractures, crossing at the S2 level.   Moderate multifactorial stenosis at the L4-5 level that would likely worsen with standing or flexion. Facet arthropathy with fluid-filled joints. 3 mm of anterolisthesis. Bulging of the disc. Foraminal stenosis on the left that could additionally compress the L4 nerve.     Electronically Signed   By: Nelson Chimes M.D.   On: 04/04/2021 11:46    I have personally reviewed the images and agree with the above interpretation.  Assessment and Plan: Ms. Simko is a pleasant 75 y.o. female with worsening lumbosacral complaints and symptoms  concerning for lumbar radiculopathy.  Her MRI does show multilevel degenerative changes however this is most significant at L4-5 resulting in significant facet arthropathy particular on the left at this level and subsequent impingement.  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  I would like to update her MRI as it is over a-year-old.  I encouraged her to pursue physical therapy and I placed a referral for this.  We also discussed the importance of weight loss as she may not be a surgical candidate until her BMI is less than 35.  She will work towards this.  I will see her back via telephone visit in 4 to 6 weeks to discuss her progress with physical therapy and review her MRI results.  Should her symptoms persist despite conservative management, she would like to discuss surgical options with Dr. Cari Caraway.  She was encouraged to call the office in the interim should she have any questions or concerns.  She  expressed understanding was in agreement with this plan.  Thank you for involving me in the care of this patient.   I spent a total of 30 minutes in both face-to-face and non-face-to-face activities for this visit on the date of this encounter including review of outside records, review of imaging, discussion of symptoms, physical exam, documentation, order placement.   Cooper Render Dept. of Neurosurgery

## 2022-08-01 ENCOUNTER — Ambulatory Visit (INDEPENDENT_AMBULATORY_CARE_PROVIDER_SITE_OTHER): Payer: Medicare Other | Admitting: Neurosurgery

## 2022-08-01 DIAGNOSIS — M5416 Radiculopathy, lumbar region: Secondary | ICD-10-CM

## 2022-08-01 DIAGNOSIS — M4316 Spondylolisthesis, lumbar region: Secondary | ICD-10-CM

## 2022-08-01 NOTE — Progress Notes (Signed)
I spoke with Ms. Birkhead today for a telephone follow up. She has continued with therapy and her last visit is 2/12. Unfortunately this has not made a meaningful difference in her pain. She has yet to undergo her updated MRI as she states no one called her to schedule. We will look into this and get her rescheduled for a telephone visit after her MRI and PT are completed. She was instructed to call the office should she not hear from anyone to schedule her MRI by this time next week. Cooper Render PA-C

## 2022-08-17 ENCOUNTER — Ambulatory Visit
Admission: RE | Admit: 2022-08-17 | Discharge: 2022-08-17 | Disposition: A | Discharge status: Home / Self Care | Payer: Medicare Other | Source: Ambulatory Visit | Attending: Neurosurgery | Admitting: Neurosurgery

## 2022-08-17 DIAGNOSIS — M4316 Spondylolisthesis, lumbar region: Secondary | ICD-10-CM | POA: Diagnosis present

## 2022-08-17 DIAGNOSIS — M5416 Radiculopathy, lumbar region: Secondary | ICD-10-CM | POA: Diagnosis present

## 2022-08-24 ENCOUNTER — Ambulatory Visit (INDEPENDENT_AMBULATORY_CARE_PROVIDER_SITE_OTHER): Payer: Medicare Other | Admitting: Neurosurgery

## 2022-08-24 DIAGNOSIS — M4316 Spondylolisthesis, lumbar region: Secondary | ICD-10-CM | POA: Diagnosis not present

## 2022-08-24 DIAGNOSIS — M5416 Radiculopathy, lumbar region: Secondary | ICD-10-CM

## 2022-08-24 NOTE — Progress Notes (Signed)
Neurosurgery Telephone (Audio-Only) Note  Requesting Provider     Sander Radon, NP [address] [place],  [place] [address] T: [phone] F: [phone]  Primary Care Provider Sander Radon, NP [place] [address] T: [phone] F: [phone]  Telehealth visit was conducted with Kathaleen Grinder, a 77 y.o. female via telephone.   History of Present Illness: Ms. Luby is a 77 y.o presenting today via telephone visit for discussion of worsening symptoms, review of updated MRI and discussion after completing PT. She has completed PT. Unfortunately despite conservative management she continues to have back and left leg pain that are significantly impacting her quality of life. She states she has lost about 5 lbs since her last visit and it hoping to be approved to start Ozempic.  06/29/2022 Ms. Manpreet Riggenbach is a 77 y.o with a history of obesity, hyperlipidemia, osteoporosis on Fosamax, hypertension, and chronic lumbosacral complaints who here today for further evaluation of low back pain that radiates into her left buttock and down the posterior lateral aspect of her left leg and into her foot.  She states is been going on for several years however is worsened over the last year making it difficult for her to participate in her daily activities.  She states she is only able to stand or walk for several minutes before having to sit down and rest.  It makes it difficult for her to do her dishes as she does not have a dishwasher.  She denies any similar right-sided symptoms although she has had these in the past.  Despite having undergone multiple injections, her symptoms continue to persist and are affecting her quality of life.  Conservative measures:  Physical therapy: None within the last year Multimodal medical therapy including regular antiinflammatories: Aleve, tramadol, and acetaminophen   Injections:  06/20/2022: Left L5-S1 transforaminal ESI  (dexamethasone 13 mg) 05/01/2022: Left L5-S1 transforaminal ESI (65% relief, dexamethasone 10 mg) 01/06/2022: Left L5-S1 transforaminal ESI (60% relief) 03/31/2021: Right L5-S1 transforaminal ESI (90% relief) 07/21/2020: Right L5-S1 transforaminal ESI (good relief) 12/24/2018: Right L5-S1 transforaminal ESI (good relief) 11/20/2018: Right S1 transforaminal ESI (moderate to good relief x2 weeks) 04/02/2018: Right S1 transforaminal ESI (good relief) 02/01/2018: Right S1 transforaminal ESI (good relief of lower extremity pain)     Past Surgery: previous lumbar surgeries ~30 years ago   Kathaleen Grinder has no symptoms of cervical myelopathy.  General Review of Systems:  A ROS was performed including pertinent positive and negatives as documented.  All other systems are negative.    Prior to Admission medications   Medication Sig Start Date End Date Taking? Authorizing Provider  Acetaminophen (TYLENOL ARTHRITIS EXT RELIEF PO) Take 650 mg by mouth every 6 (six) hours as needed (pain).    [provider]  alendronate (FOSAMAX) 70 MG tablet Take 70 mg by mouth once a week. Take with a full glass of water on an empty stomach. On wednesday    [provider]  atorvastatin (LIPITOR) 10 MG tablet Take 10 mg by mouth daily. 03/23/21   [provider]  calcium carbonate (OS-CAL - DOSED IN MG OF ELEMENTAL CALCIUM) 1250 (500 Ca) MG tablet Take 1 tablet by mouth 2 (two) times daily with a meal.    [provider]  ergocalciferol (VITAMIN D2) 50000 units capsule Take 50,000 Units by mouth once a week. saturdays    [provider]  lisinopril (PRINIVIL,ZESTRIL) 10 MG tablet Take 10 mg by mouth daily.  [provider]  magnesium oxide (MAG-OX) 400 MG tablet Take 400 mg by mouth daily.    [provider]  naproxen sodium (ALEVE) 220 MG tablet Take 220 mg by mouth 2 (two) times daily as needed (pain).    [provider]  traMADol  (ULTRAM) 50 MG tablet Take 50 mg by mouth every 6 (six) hours as needed for moderate pain.    [provider]   DATA REVIEWED    Imaging Studies  MRI L spine 08/27/22 FINDINGS: Segmentation:  Standard.   Alignment:  Progressive L4-L5 anterolisthesis, now 5 mm.   Vertebrae: No acute fracture, evidence of discitis, or bone lesion. Unchanged large T12 superior endplate Schmorl's node. Chronic S2 fracture again noted.   Conus medullaris and cauda equina: Conus extends to the L1 level. Conus and cauda equina appear normal.   Paraspinal and other soft tissues: Left renal simple cysts. No follow-up imaging is recommended.   Disc levels:   T12-L1: Similar asymmetric right-sided disc bulging and facet arthropathy with unchanged moderate to severe right neuroforaminal stenosis. No spinal canal or left neuroforaminal stenosis.   L1-L2:  No significant disc bulge or herniation.  No stenosis.   L2-L3: Unchanged mild foraminal disc bulging. Increased superimposed small left foraminal disc protrusion. New mild left neuroforaminal stenosis. No spinal canal or right neuroforaminal stenosis.   L3-L4: Unchanged small broad-based posterior disc protrusion and mild bilateral facet arthropathy. Unchanged mild left neuroforaminal stenosis. No spinal canal right neuroforaminal stenosis.   L4-L5: Progressive disc uncovering. Continued asymmetric left-sided disc bulging. Progressive severe bilateral facet arthropathy. Worsened now moderate to severe spinal canal stenosis. Worsened now severe left neuroforaminal stenosis. No right neuroforaminal stenosis.   L5-S1: Prior left hemilaminectomy. Unchanged left eccentric small circumferential disc osteophyte complex. Progressive moderate left and unchanged mild right facet arthropathy. No stenosis.   IMPRESSION: 1. Multilevel lumbar spondylosis as described above, worst at L4-L5 where there is progressive now moderate to severe spinal canal  and severe left neuroforaminal stenosis.     Electronically Signed   By: Titus Dubin M.D.   On: 08/18/2022 13:4   IMPRESSION  Ms. Wavra is a 77 y.o. female who I performed a telephone encounter today for evaluation and management of lumbar stenosis with radiculopathy  PLAN  Ms. Traverse is a pleasant 77 y.o with lumbar stenosis and radiculopathy despite conservative management. She would like to talk about surgical options. We will have her follow up with Dr. Izora Ribas for this. We discussed the continued importance of weight loss and she will continue to work on this in the meantime. She was encouraged to call the office in the interim with any questions or concerns.  No orders of the defined types were placed in this encounter.   DISPOSITION  Follow up: In person appointment in  Next available new with Dr. Izora Ribas to discuss possible surgical options  Zennie Ayars Sampson Goon, PA   TELEPHONE DOCUMENTATION   This visit was performed via telephone.  Patient location: home Provider location: office  I spent a total of 5 minutes non-face-to-face activities for this visit on the date of this encounter including review of current clinical condition and response to treatment.  The patient is aware of and accepts the limits of this telehealth visit.

## 2022-08-25 ENCOUNTER — Telehealth: Payer: Medicare Other | Admitting: Neurosurgery

## 2022-08-30 ENCOUNTER — Telehealth: Payer: Self-pay

## 2022-08-30 NOTE — Telephone Encounter (Signed)
I called the patient 2 times and was unable to reach her or leave a voicemail. I will call her again tomorrow.

## 2022-08-30 NOTE — Telephone Encounter (Signed)
-----   Message from Bryan sent at 08/30/2022 10:31 AM EST ----- Regarding: Pain Has a telephone appt scheduled with [name] 3/7. Called in to get advise for pain in groin area radiating down left leg.  Asked if someone could give her a call.  CB: [phone]

## 2022-08-31 NOTE — Telephone Encounter (Signed)
Called the patient again and the phone just continues to ring and I'm unable to leave a message.

## 2022-08-31 NOTE — Telephone Encounter (Signed)
I attempted to call the patient again and the phone just rings and I am unable to leave a voicemail.

## 2022-09-01 NOTE — Telephone Encounter (Signed)
FYI in case she calls--This is the 5th time I have tried calling the patient and it still just rings and I'm not able to leave a voicemail.

## 2022-09-04 NOTE — Telephone Encounter (Signed)
I spoke with the patient and she stated that she would be able to come into the office on 09/07/22.  She is having a lot of trouble sleeping at night, she is currently taking Tramadol '50mg'$ , 1 in the morning and 1 in the evening. Could she try a muscle relaxer to see if this would help? She is not currently taking one.   [employer].

## 2022-09-04 NOTE — Telephone Encounter (Signed)
FYI

## 2022-09-04 NOTE — Telephone Encounter (Signed)
Patient called back and her phone number has been updated. She requested to speak to [facility]. I told patient that [name] is in surgery all day today. She is aware that she has a telephone visit with [name] on 3/7. She wants [name] to know that she is no longer mobile and not sleeping at night due to the pain.

## 2022-09-05 ENCOUNTER — Other Ambulatory Visit: Payer: Self-pay | Admitting: Neurosurgery

## 2022-09-05 MED ORDER — METHOCARBAMOL 500 MG PO TABS: 500.0000 mg | ORAL_TABLET | Freq: Two times a day (BID) | ORAL | 0 refills | Status: DC | PRN

## 2022-09-05 NOTE — Telephone Encounter (Signed)
Please let her know that Taylor Morrow sent in a muscle relaxer to try.

## 2022-09-07 ENCOUNTER — Ambulatory Visit (INDEPENDENT_AMBULATORY_CARE_PROVIDER_SITE_OTHER): Payer: Medicare Other | Admitting: Neurosurgery

## 2022-09-07 DIAGNOSIS — M4316 Spondylolisthesis, lumbar region: Secondary | ICD-10-CM | POA: Diagnosis not present

## 2022-09-07 DIAGNOSIS — M5416 Radiculopathy, lumbar region: Secondary | ICD-10-CM | POA: Diagnosis not present

## 2022-09-07 NOTE — Progress Notes (Signed)
Neurosurgery Telephone (Audio-Only) Note  Requesting Provider     No referring provider defined for this encounter. T: N/A F:   Primary Care Provider Sander Radon, NP [place] [address] T: [phone] F: [phone]  Telehealth visit was conducted with Kathaleen Grinder, a 77 y.o. female via telephone.   History of Present Illness: 09/07/22 Ms. Caviness is a 77 y.o presenting today via telephone visit to again discuss her symptoms. She reports increased low back pain and some intermittent groin pain which is new. She is now heavily relying on a wheelchair due to her pain.   08/24/22 Ms. Griffiths is a 77 y.o presenting today via telephone visit for discussion of worsening symptoms, review of updated MRI and discussion after completing PT. She has completed PT. Unfortunately despite conservative management she continues to have back and left leg pain that are significantly impacting her quality of life. She states she has lost about 5 lbs since her last visit and it hoping to be approved to start Ozempic.  06/29/2022 Ms. Krystyl Kieper is a 77 y.o with a history of obesity, hyperlipidemia, osteoporosis on Fosamax, hypertension, and chronic lumbosacral complaints who here today for further evaluation of low back pain that radiates into her left buttock and down the posterior lateral aspect of her left leg and into her foot.  She states is been going on for several years however is worsened over the last year making it difficult for her to participate in her daily activities.  She states she is only able to stand or walk for several minutes before having to sit down and rest.  It makes it difficult for her to do her dishes as she does not have a dishwasher.  She denies any similar right-sided symptoms although she has had these in the past.  Despite having undergone multiple injections, her symptoms continue to persist and are affecting her quality of life.  Conservative  measures:  Physical therapy: None within the last year Multimodal medical therapy including regular antiinflammatories: Aleve, tramadol, and acetaminophen   Injections:  06/20/2022: Left L5-S1 transforaminal ESI (dexamethasone 13 mg) 05/01/2022: Left L5-S1 transforaminal ESI (65% relief, dexamethasone 10 mg) 01/06/2022: Left L5-S1 transforaminal ESI (60% relief) 03/31/2021: Right L5-S1 transforaminal ESI (90% relief) 07/21/2020: Right L5-S1 transforaminal ESI (good relief) 12/24/2018: Right L5-S1 transforaminal ESI (good relief) 11/20/2018: Right S1 transforaminal ESI (moderate to good relief x2 weeks) 04/02/2018: Right S1 transforaminal ESI (good relief) 02/01/2018: Right S1 transforaminal ESI (good relief of lower extremity pain)     Past Surgery: previous lumbar surgeries ~30 years ago   Kathaleen Grinder has no symptoms of cervical myelopathy.  General Review of Systems:  A ROS was performed including pertinent positive and negatives as documented.  All other systems are negative.    Prior to Admission medications   Medication Sig Start Date End Date Taking? Authorizing Provider  Acetaminophen (TYLENOL ARTHRITIS EXT RELIEF PO) Take 650 mg by mouth every 6 (six) hours as needed (pain).    [provider]  alendronate (FOSAMAX) 70 MG tablet Take 70 mg by mouth once a week. Take with a full glass of water on an empty stomach. On wednesday    [provider]  atorvastatin (LIPITOR) 10 MG tablet Take 10 mg by mouth daily. 03/23/21   [provider]  calcium carbonate (OS-CAL - DOSED IN MG OF ELEMENTAL CALCIUM) 1250 (500 Ca) MG tablet Take 1 tablet by mouth 2 (two) times daily with a  meal.    [provider]  ergocalciferol (VITAMIN D2) 50000 units capsule Take 50,000 Units by mouth once a week. saturdays    [provider]  lisinopril (PRINIVIL,ZESTRIL) 10 MG tablet Take 10 mg by mouth daily.    [provider]  magnesium oxide  (MAG-OX) 400 MG tablet Take 400 mg by mouth daily.    [provider]  naproxen sodium (ALEVE) 220 MG tablet Take 220 mg by mouth 2 (two) times daily as needed (pain).    [provider]  traMADol (ULTRAM) 50 MG tablet Take 50 mg by mouth every 6 (six) hours as needed for moderate pain.    [provider]   DATA REVIEWED    Imaging Studies  MRI L spine 08/27/22 FINDINGS: Segmentation:  Standard.   Alignment:  Progressive L4-L5 anterolisthesis, now 5 mm.   Vertebrae: No acute fracture, evidence of discitis, or bone lesion. Unchanged large T12 superior endplate Schmorl's node. Chronic S2 fracture again noted.   Conus medullaris and cauda equina: Conus extends to the L1 level. Conus and cauda equina appear normal.   Paraspinal and other soft tissues: Left renal simple cysts. No follow-up imaging is recommended.   Disc levels:   T12-L1: Similar asymmetric right-sided disc bulging and facet arthropathy with unchanged moderate to severe right neuroforaminal stenosis. No spinal canal or left neuroforaminal stenosis.   L1-L2:  No significant disc bulge or herniation.  No stenosis.   L2-L3: Unchanged mild foraminal disc bulging. Increased superimposed small left foraminal disc protrusion. New mild left neuroforaminal stenosis. No spinal canal or right neuroforaminal stenosis.   L3-L4: Unchanged small broad-based posterior disc protrusion and mild bilateral facet arthropathy. Unchanged mild left neuroforaminal stenosis. No spinal canal right neuroforaminal stenosis.   L4-L5: Progressive disc uncovering. Continued asymmetric left-sided disc bulging. Progressive severe bilateral facet arthropathy. Worsened now moderate to severe spinal canal stenosis. Worsened now severe left neuroforaminal stenosis. No right neuroforaminal stenosis.   L5-S1: Prior left hemilaminectomy. Unchanged left eccentric small circumferential disc osteophyte complex. Progressive  moderate left and unchanged mild right facet arthropathy. No stenosis.   IMPRESSION: 1. Multilevel lumbar spondylosis as described above, worst at L4-L5 where there is progressive now moderate to severe spinal canal and severe left neuroforaminal stenosis.     Electronically Signed   By: Titus Dubin M.D.   On: 08/18/2022 13:4   IMPRESSION  Ms. Quinton is a 77 y.o. female who I performed a telephone encounter today for evaluation and management of lumbar stenosis with radiculopathy  PLAN  Ms. Balian is a pleasant 77 y.o with lumbar stenosis and radiculopathy despite conservative management. She was started on Robaxin which has helped some. We will continue with the plan for her to follow up with Dr. Izora Ribas on 3/19 as scheduled  No orders of the defined types were placed in this encounter.   DISPOSITION  Follow up: In person appointment in  Next available new with Dr. Izora Ribas to discuss possible surgical options  Saria Haran Sampson Goon, PA   TELEPHONE DOCUMENTATION   This visit was performed via telephone.  Patient location: home Provider location: office  I spent a total of 5 minutes non-face-to-face activities for this visit on the date of this encounter including review of current clinical condition and response to treatment.  The patient is aware of and accepts the limits of this telehealth visit.

## 2022-09-15 NOTE — Progress Notes (Signed)
Referring Physician:  Alisia Ferrari, NP [address] [place],  [place] [address]  Primary Physician:  Oneal Grout, FNP  History of Present Illness: 09/19/2022 [name] is here today with a chief complaint of primarily left leg pain.  She also has back pain, but her leg pain is worse.  She has been having worsening pain over several years although it is only recently that she is now unable to walk for any distance.  Standing and walking make her pain worse.  Sitting down makes it better.  She has severe pain into her left heel at times.  She has tried and failed conservative management.  Bowel/Bladder Dysfunction: none  Conservative measures:  Physical therapy:  has participated in at [facility] and was discharged 3/7/4 Multimodal medical therapy including regular antiinflammatories:   aleve, tramadol, tylenol, robaxin Injections:  has received epidural steroid injections   06/20/2022: Left L5-S1 transforaminal ESI (dexamethasone 13 mg) 05/01/2022: Left L5-S1 transforaminal ESI (65% relief, dexamethasone 10 mg) 01/06/2022: Left L5-S1 transforaminal ESI (60% relief) 03/31/2021: Right L5-S1 transforaminal ESI (90% relief) 07/21/2020: Right L5-S1 transforaminal ESI (good relief) 12/24/2018: Right L5-S1 transforaminal ESI (good relief) 11/20/2018: Right S1 transforaminal ESI (moderate to good relief x2 weeks) 04/02/2018: Right S1 transforaminal ESI (good relief) 02/01/2018: Right S1 transforaminal ESI (good relief of lower extremity pain)   Past Surgery:  previous lumbar surgeries ~30 years ago    Taylor Morrow has no symptoms of cervical myelopathy.   The symptoms are causing a significant impact on the patient's life.   Seen Janetta Hora PA  09/07/2022  History of Present Illness: 09/07/22 [name] is a 77 y.o presenting today via telephone visit to again discuss her symptoms. She reports increased low back pain and some intermittent groin pain which is new. She  is now heavily relying on a wheelchair due to her pain.    08/24/22 [name] is a 77 y.o presenting today via telephone visit for discussion of worsening symptoms, review of updated MRI and discussion after completing PT. She has completed PT. Unfortunately despite conservative management she continues to have back and left leg pain that are significantly impacting her quality of life. She states she has lost about 5 lbs since her last visit and it hoping to be approved to start Ozempic.   06/29/2022 [name] is a 77 y.o with a history of obesity, hyperlipidemia, osteoporosis on Fosamax, hypertension, and chronic lumbosacral complaints who here today for further evaluation of low back pain that radiates into her left buttock and down the posterior lateral aspect of her left leg and into her foot.  She states is been going on for several years however is worsened over the last year making it difficult for her to participate in her daily activities.  She states she is only able to stand or walk for several minutes before having to sit down and rest.  It makes it difficult for her to do her dishes as she does not have a dishwasher.  She denies any similar right-sided symptoms although she has had these in the past.  Despite having undergone multiple injections, her symptoms continue to persist and are affecting her quality of life.   Conservative measures:  Physical therapy: None within the last year Multimodal medical therapy including regular antiinflammatories: Aleve, tramadol, and acetaminophen   Injections:  06/20/2022: Left L5-S1 transforaminal ESI (dexamethasone 13 mg) 05/01/2022: Left L5-S1 transforaminal ESI (65% relief, dexamethasone 10 mg) 01/06/2022: Left L5-S1  transforaminal ESI (60% relief) 03/31/2021: Right L5-S1 transforaminal ESI (90% relief) 07/21/2020: Right L5-S1 transforaminal ESI (good relief) 12/24/2018: Right L5-S1 transforaminal ESI (good relief) 11/20/2018: Right S1  transforaminal ESI (moderate to good relief x2 weeks) 04/02/2018: Right S1 transforaminal ESI (good relief) 02/01/2018: Right S1 transforaminal ESI (good relief of lower extremity pain)      I have utilized the care everywhere function in epic to review the outside records available from external health systems.  Review of Systems:  A 10 point review of systems is negative, except for the pertinent positives and negatives detailed in the HPI.  Past Medical History: Past Medical History:  Diagnosis Date   Hypertension    Vitamin D deficiency     Past Surgical History: Past Surgical History:  Procedure Laterality Date   BACK SURGERY     COLONOSCOPY WITH PROPOFOL N/A 10/27/2015   Procedure: COLONOSCOPY WITH PROPOFOL;  Surgeon: Scot Jun, MD;  Location: [facility] ENDOSCOPY;  Service: Endoscopy;  Laterality: N/A;   COLONOSCOPY WITH PROPOFOL N/A 04/07/2019   Procedure: COLONOSCOPY WITH PROPOFOL;  Surgeon: Toledo, Boykin Nearing, MD;  Location: [facility] ENDOSCOPY;  Service: Gastroenterology;  Laterality: N/A;   FOOT SURGERY Bilateral    right 2013  left  2014   HERNIA REPAIR     2007   ROTATOR CUFF REPAIR Right    THYROID SURGERY      Allergies: Allergies as of 09/19/2022   (No Known Allergies)    Medications: Current Meds  Medication Sig   Acetaminophen (TYLENOL ARTHRITIS EXT RELIEF PO) Take 650 mg by mouth every 6 (six) hours as needed (pain).   alendronate (FOSAMAX) 70 MG tablet Take 70 mg by mouth once a week. Take with a full glass of water on an empty stomach. On wednesday   atorvastatin (LIPITOR) 10 MG tablet Take 10 mg by mouth daily.   calcium carbonate (OS-CAL - DOSED IN MG OF ELEMENTAL CALCIUM) 1250 (500 Ca) MG tablet Take 1 tablet by mouth 2 (two) times daily with a meal.   ergocalciferol (VITAMIN D2) 50000 units capsule Take 50,000 Units by mouth once a week. saturdays   lisinopril (PRINIVIL,ZESTRIL) 10 MG tablet Take 10 mg by mouth daily.   magnesium oxide (MAG-OX) 400  MG tablet Take 400 mg by mouth daily.   methocarbamol (ROBAXIN) 500 MG tablet Take 1 tablet (500 mg total) by mouth 2 (two) times daily as needed for muscle spasms.   naproxen sodium (ALEVE) 220 MG tablet Take 220 mg by mouth 2 (two) times daily as needed (pain).   traMADol (ULTRAM) 50 MG tablet Take 50 mg by mouth every 6 (six) hours as needed for moderate pain.    Social History: Social History   Tobacco Use   Smoking status: Never   Smokeless tobacco: Never  Substance Use Topics   Alcohol use: No    Alcohol/week: 0.0 standard drinks of alcohol   Drug use: No    Family Medical History: Family History  Problem Relation Age of Onset   Hypertension Mother    Prostate cancer Neg Hx    Kidney cancer Neg Hx    Bladder Cancer Neg Hx     Physical Examination: Vitals:   09/19/22 1257  BP: 127/74  Pulse: 84  SpO2: 97%    General: Patient is well developed, well nourished, calm, collected, and in no apparent distress. Attention to examination is appropriate.  Neck:   Supple.  Full range of motion.  Respiratory: Patient is breathing without any  difficulty.   NEUROLOGICAL:     Awake, alert, oriented to person, place, and time.  Speech is clear and fluent.   Cranial Nerves: Pupils equal round and reactive to light.  Facial tone is symmetric.  Facial sensation is symmetric. Shoulder shrug is symmetric. Tongue protrusion is midline.  There is no pronator drift.  ROM of spine: full.    Strength: Side Biceps Triceps Deltoid Interossei Grip Wrist Ext. Wrist Flex.  R 5 5 5 5 5 5 5   L 5 5 5 5 5 5 5    Side Iliopsoas Quads Hamstring PF DF EHL  R 5 5 5 5 5 5   L 5 5 5 5 5 5    Reflexes are 1+ and symmetric at the biceps, triceps, brachioradialis, patella and achilles.   Hoffman's is absent.   Bilateral upper and lower extremity sensation is intact to light touch.    No evidence of dysmetria noted.  Gait is abnormal-she requires a wheelchair due to inability to walk distance.      Medical Decision Making  Imaging: MRI L spine 08/18/2022 IMPRESSION: 1. Multilevel lumbar spondylosis as described above, worst at L4-L5 where there is progressive now moderate to severe spinal canal and severe left neuroforaminal stenosis.     Electronically Signed   By: Obie Dredge M.D.   On: 08/18/2022 13:42  I have personally reviewed the images and agree with the above interpretation.  She also has an anterolisthesis of L4 and L5.  Assessment and Plan: Taylor Morrow is a pleasant 77 y.o. 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  She has low back pain with left-sided sciatica.  She has tried and failed conservative management.  No further conservative management is indicated.  I recommended surgical intervention.  She is a candidate for multiple different approaches, but I have recommended an L4-5 lateral lumbar interbody fusion with posterior fixation.  We will review her x-rays to ensure that she is an appropriate candidate for this.  If she is not, a L4-5 transforaminal lumbar interbody fusion is possible.  I discussed the planned procedure at length with the patient, including the risks, benefits, alternatives, and indications. The risks discussed include but are not limited to bleeding, infection, need for reoperation, spinal fluid leak, stroke, vision loss, anesthetic complication, coma, paralysis, and even death. I also described the possibility of psoas weakness and paresthesias. I described in detail that improvement was not guaranteed.  The patient expressed understanding of these risks, and asked that we proceed with surgery. I described the surgery in layman's terms, and gave ample opportunity for questions, which were answered to the best of my ability.     Thank you for involving me in the care of this patient.      Evie Croston K. Myer Haff MD, [facility] Neurosurgery

## 2022-09-19 ENCOUNTER — Encounter: Payer: Self-pay | Admitting: Neurosurgery

## 2022-09-19 ENCOUNTER — Ambulatory Visit (INDEPENDENT_AMBULATORY_CARE_PROVIDER_SITE_OTHER): Payer: Medicare Other | Admitting: Neurosurgery

## 2022-09-19 VITALS — BP 127/74 | HR 84 | Ht 61.0 in | Wt 201.0 lb

## 2022-09-19 DIAGNOSIS — M5442 Lumbago with sciatica, left side: Secondary | ICD-10-CM | POA: Diagnosis not present

## 2022-09-19 DIAGNOSIS — G8929 Other chronic pain: Secondary | ICD-10-CM

## 2022-09-19 DIAGNOSIS — M4316 Spondylolisthesis, lumbar region: Secondary | ICD-10-CM | POA: Diagnosis not present

## 2022-09-19 NOTE — Patient Instructions (Signed)
Please see below for information in regards to your upcoming surgery:   Planned surgery: L4-5 lateral lumbar interbody fusion and posterior spinal fusion   Surgery date: 10/23/22 - you will find out your arrival time the business day before your surgery.   Pre-op appointment at [facility]: we will call you with a date/time for this. Pre-admit testing is located on the first floor of the [facility], [place], [address]. Please bring all prescriptions in the original prescription bottles to your appointment, even if you have reviewed medications by phone with a pharmacy representative. Pre-op labs may be done at your pre-op appointment. You are not required to fast for these labs. Should you need to change your pre-op appointment, please call Pre-admit testing at [phone].    Surgical clearance: we will send a clearance form to Westley Chandler, FNP    NSAIDS (Non-steroidal anti-inflammatory drugs): because you are having a fusion, no NSAIDS (such as ibuprofen, aleve, naproxen, meloxicam, diclofenac) for 3 months after surgery. Celebrex is an exception. Tylenol is ok because it is not an NSAID.    Home health physical therapy: [facility] [place] will contact you regarding home health physical therapy for after surgery.Their number is [phone].   Because you are having a fusion: for appointments after your 2 week follow-up: please arrive at the [facility] outpatient imaging center ([place], [place]) or [facility] one hour prior to your appointment for x-rays. This applies to every appointment after your 2 week follow-up. Failure to do so may result in your appointment being rescheduled.    We can be reached by phone or mychart 8am-4pm, Monday-Friday. If you have any questions/concerns before or after surgery, you can reach [name] at [phone], or you can send a mychart  message. If you have a concern after hours that cannot wait until normal business hours, you can call [phone] and ask to page the neurosurgeon on call for [place].    Appointments/FMLA & disability paperwork: [place]  Nurse: Ophelia Shoulder  Medical assistants: Lum Keas Physician Assistant's: [place] Surgeon: Meade Maw, MD

## 2022-09-22 ENCOUNTER — Other Ambulatory Visit: Payer: Self-pay

## 2022-09-22 ENCOUNTER — Inpatient Hospital Stay
Admission: RE | Admit: 2022-09-22 | Discharge: 2022-09-22 | Disposition: A | Discharge status: Home / Self Care | Payer: Self-pay | Source: Ambulatory Visit | Attending: Neurosurgery | Admitting: Neurosurgery

## 2022-09-22 ENCOUNTER — Telehealth: Payer: Self-pay

## 2022-09-22 DIAGNOSIS — Z049 Encounter for examination and observation for unspecified reason: Secondary | ICD-10-CM

## 2022-09-22 NOTE — Telephone Encounter (Signed)
Xrays from [facility] are now loaded to [facility]. Please review and let me know if the plan is staying the same (L4-5 XLIF/PSF). Thanks!

## 2022-09-22 NOTE — Telephone Encounter (Signed)
I notified the patient that we will keep the original plan. She would also like to move up her surgery date if we have a cancellation. I have made Dr Izora Ribas aware.

## 2022-09-25 ENCOUNTER — Other Ambulatory Visit: Payer: Self-pay

## 2022-09-25 DIAGNOSIS — Z01818 Encounter for other preprocedural examination: Secondary | ICD-10-CM

## 2022-10-10 ENCOUNTER — Inpatient Hospital Stay
Admission: RE | Admit: 2022-10-10 | Discharge: 2022-10-10 | Disposition: A | Discharge status: Home / Self Care | Payer: Medicare Other | Source: Ambulatory Visit

## 2022-10-10 DIAGNOSIS — Z01812 Encounter for preprocedural laboratory examination: Secondary | ICD-10-CM

## 2022-10-10 NOTE — Patient Instructions (Signed)
Your procedure is scheduled on: 10/23/22 - Monday Report to the Registration Desk on the 1st floor of the [facility]. To find out your arrival time, please call [phone] between 1PM - 3PM on: 10/20/22 - Friday If your arrival time is 6:00 am, do not arrive before that time as the [facility] entrance doors do not open until 6:00 am.  REMEMBER: Instructions that are not followed completely may result in serious medical risk, up to and including death; or upon the discretion of your surgeon and anesthesiologist your surgery may need to be rescheduled.  Do not eat food after midnight the night before surgery.  No gum chewing or hard candies.  You may however, drink CLEAR liquids up to 2 hours before you are scheduled to arrive for your surgery. Do not drink anything within 2 hours of your scheduled arrival time.  Clear liquids include: - water  - apple juice without pulp - gatorade (not RED colors) - black coffee or tea (Do NOT add milk or creamers to the coffee or tea) Do NOT drink anything that is not on this list.  One week prior to surgery: Stop taking beginning 10/16/22   Anti-inflammatories (NSAIDS) such as Advil, Aleve, Ibuprofen, Motrin, Naproxen, Naprosyn and Aspirin based products such as Excedrin, Goody's Powder, BC Powder.  Stop taking beginning 10/23/22, ANY OVER THE COUNTER supplements until after surgery.  You may however, continue to take Tylenol if needed for pain up until the day of surgery.   TAKE ONLY THESE MEDICATIONS THE MORNING OF SURGERY WITH A SIP OF WATER:  NONE   No Alcohol for 24 hours before or after surgery.  No Smoking including e-cigarettes for 24 hours before surgery.  No chewable tobacco products for at least 6 hours before surgery.  No nicotine patches on the day of surgery.  Do not use any "recreational" drugs for at least a week (preferably 2 weeks) before your surgery.  Please be advised that the combination of cocaine and anesthesia  may have negative outcomes, up to and including death. If you test positive for cocaine, your surgery will be cancelled.  On the morning of surgery brush your teeth with toothpaste and water, you may rinse your mouth with mouthwash if you wish. Do not swallow any toothpaste or mouthwash.  Use CHG Soap or wipes as directed on instruction sheet.  Do not wear jewelry, make-up, hairpins, clips or nail polish.  Do not wear lotions, powders, or perfumes.   Do not shave body hair from the neck down 48 hours before surgery.  Contact lenses, hearing aids and dentures may not be worn into surgery.  Do not bring valuables to the hospital. [facility] is not responsible for any missing/lost belongings or valuables. .   Notify your doctor if there is any change in your medical condition (cold, fever, infection).  Wear comfortable clothing (specific to your surgery type) to the hospital.  After surgery, you can help prevent lung complications by doing breathing exercises.  Take deep breaths and cough every 1-2 hours. Your doctor may order a device called an Incentive Spirometer to help you take deep breaths. When coughing or sneezing, hold a pillow firmly against your incision with both hands. This is called "splinting." Doing this helps protect your incision. It also decreases belly discomfort.  If you are being admitted to the hospital overnight, leave your suitcase in the car. After surgery it may be brought to your room.  In case of increased patient  census, it may be necessary for you, the patient, to continue your postoperative care in the Same Day Surgery department.  If you are being discharged the day of surgery, you will not be allowed to drive home. You will need a responsible individual to drive you home and stay with you for 24 hours after surgery.   If you are taking public transportation, you will need to have a responsible individual with you.  Please call the Pre-admissions  Testing Dept. at [phone] if you have any questions about these instructions.  Surgery Visitation Policy:  Patients having surgery or a procedure may have two visitors.  Children under the age of 25 must have an adult with them who is not the patient.  Inpatient Visitation:    Visiting hours are 7 a.m. to 8 p.m. Up to four visitors are allowed at one time in a patient room. The visitors may rotate out with other people during the day.  One visitor age 55 or older may stay with the patient overnight and must be in the room by 8 p.m.    Preparing for Surgery with CHLORHEXIDINE GLUCONATE (CHG) Soap  Chlorhexidine Gluconate (CHG) Soap  o An antiseptic cleaner that kills germs and bonds with the skin to continue killing germs even after washing  o Used for showering the night before surgery and morning of surgery  Before surgery, you can play an important role by reducing the number of germs on your skin.  CHG (Chlorhexidine gluconate) soap is an antiseptic cleanser which kills germs and bonds with the skin to continue killing germs even after washing.  Please do not use if you have an allergy to CHG or antibacterial soaps. If your skin becomes reddened/irritated stop using the CHG.  1. Shower the NIGHT BEFORE SURGERY and the MORNING OF SURGERY with CHG soap.  2. If you choose to wash your hair, wash your hair first as usual with your normal shampoo.  3. After shampooing, rinse your hair and body thoroughly to remove the shampoo.  4. Use CHG as you would any other liquid soap. You can apply CHG directly to the skin and wash gently with a scrungie or a clean washcloth.  5. Apply the CHG soap to your body only from the neck down. Do not use on open wounds or open sores. Avoid contact with your eyes, ears, mouth, and genitals (private parts). Wash face and genitals (private parts) with your normal soap.  6. Wash thoroughly, paying special attention to the area where your surgery  will be performed.  7. Thoroughly rinse your body with warm water.  8. Do not shower/wash with your normal soap after using and rinsing off the CHG soap.  9. Pat yourself dry with a clean towel.  10. Wear clean pajamas to bed the night before surgery.  12. Place clean sheets on your bed the night of your first shower and do not sleep with pets.  13. Shower again with the CHG soap on the day of surgery prior to arriving at the hospital.  14. Do not apply any deodorants/lotions/powders.  15. Please wear clean clothes to the hospital.

## 2022-10-11 ENCOUNTER — Other Ambulatory Visit: Payer: Self-pay

## 2022-10-11 ENCOUNTER — Encounter
Admission: RE | Admit: 2022-10-11 | Discharge: 2022-10-11 | Disposition: A | Discharge status: Home / Self Care | Payer: Medicare Other | Source: Ambulatory Visit | Attending: Neurosurgery | Admitting: Neurosurgery

## 2022-10-11 VITALS — BP 144/93 | HR 77 | Resp 16 | Ht 61.0 in | Wt 205.7 lb

## 2022-10-11 DIAGNOSIS — R9431 Abnormal electrocardiogram [ECG] [EKG]: Secondary | ICD-10-CM | POA: Diagnosis not present

## 2022-10-11 DIAGNOSIS — Z01818 Encounter for other preprocedural examination: Secondary | ICD-10-CM | POA: Insufficient documentation

## 2022-10-11 DIAGNOSIS — Z01812 Encounter for preprocedural laboratory examination: Secondary | ICD-10-CM

## 2022-10-11 DIAGNOSIS — K760 Fatty (change of) liver, not elsewhere classified: Secondary | ICD-10-CM | POA: Insufficient documentation

## 2022-10-11 DIAGNOSIS — R8281 Pyuria: Secondary | ICD-10-CM | POA: Diagnosis not present

## 2022-10-11 DIAGNOSIS — D72828 Other elevated white blood cell count: Secondary | ICD-10-CM | POA: Diagnosis not present

## 2022-10-11 DIAGNOSIS — Z01810 Encounter for preprocedural cardiovascular examination: Secondary | ICD-10-CM

## 2022-10-11 HISTORY — DX: Personal history of other diseases of the digestive system: Z87.19

## 2022-10-11 HISTORY — DX: Unspecified osteoarthritis, unspecified site: M19.90

## 2022-10-11 HISTORY — DX: Prediabetes: R73.03

## 2022-10-11 LAB — BASIC METABOLIC PANEL
Anion gap: 7 (ref 5–15)
BUN: 21 mg/dL (ref 8–23)
CO2: 27 mmol/L (ref 22–32)
Calcium: 9.6 mg/dL (ref 8.9–10.3)
Chloride: 105 mmol/L (ref 98–111)
Creatinine, Ser: 0.89 mg/dL (ref 0.44–1.00)
GFR, Estimated: >60 mL/min (ref >60)
Glucose, Bld: 101 mg/dL — ABNORMAL HIGH (ref 70–99)
Potassium: 4.1 mmol/L (ref 3.5–5.1)
Sodium: 139 mmol/L (ref 135–145)

## 2022-10-11 LAB — URINALYSIS, COMPLETE (UACMP) WITH MICROSCOPIC
Bilirubin Urine: NEGATIVE
Glucose, UA: NEGATIVE mg/dL
Hgb urine dipstick: NEGATIVE
Ketones, ur: NEGATIVE mg/dL
Nitrite: NEGATIVE
Protein, ur: NEGATIVE mg/dL
Specific Gravity, Urine: 1.024 (ref 1.005–1.030)
WBC, UA: >50 WBC/hpf (ref 0–5)
pH: 5 (ref 5.0–8.0)

## 2022-10-11 LAB — CBC
HCT: 40 % (ref 36.0–46.0)
Hemoglobin: 12.6 g/dL (ref 12.0–15.0)
MCH: 27.9 pg (ref 26.0–34.0)
MCHC: 31.5 g/dL (ref 30.0–36.0)
MCV: 88.7 fL (ref 80.0–100.0)
Platelets: 229 10*3/uL (ref 150–400)
RBC: 4.51 MIL/uL (ref 3.87–5.11)
RDW: 13.9 % (ref 11.5–15.5)
WBC: 5.5 10*3/uL (ref 4.0–10.5)
nRBC: 0 % (ref 0.0–0.2)

## 2022-10-11 LAB — TYPE AND SCREEN
ABO/RH(D): A NEG
Antibody Screen: NEGATIVE

## 2022-10-11 LAB — SURGICAL PCR SCREEN
MRSA, PCR: NEGATIVE
Staphylococcus aureus: NEGATIVE

## 2022-10-11 NOTE — Patient Instructions (Addendum)
Your procedure is scheduled on: 10/23/22 Monday Report to the Registration Desk on the 1st floor of the [facility]. To find out your arrival time, please call [phone] between 1PM - 3PM on: 10/20/22 - Friday If your arrival time is 6:00 am, do not arrive before that time as the [facility] entrance doors do not open until 6:00 am.  REMEMBER: Instructions that are not followed completely may result in serious medical risk, up to and including death; or upon the discretion of your surgeon and anesthesiologist your surgery may need to be rescheduled.  Do not eat food after midnight the night before surgery.  No gum chewing or hard candies.  You may however, drink CLEAR liquids up to 2 hours before you are scheduled to arrive for your surgery. Do not drink anything within 2 hours of your scheduled arrival time.  Clear liquids include: - water  - apple juice without pulp - gatorade (not RED colors) - black coffee or tea (Do NOT add milk or creamers to the coffee or tea) Do NOT drink anything that is not on this list.  One week prior to surgery: Stop taking beginning 10/16/22 , Anti-inflammatories (NSAIDS) such as Advil, Aleve, Ibuprofen, Motrin, Naproxen, Naprosyn and Aspirin based products such as Excedrin, Goody's Powder, BC Powder.  Stop ANY OVER THE COUNTER supplements until after surgery beginning 10/16/22.  You may however, continue to take Tylenol if needed for pain up until the day of surgery.  TAKE ONLY THESE MEDICATIONS THE MORNING OF SURGERY WITH A SIP OF WATER:  atorvastatin (LIPITOR) methocarbamol (ROBAXIN)     No Alcohol for 24 hours before or after surgery.  No Smoking including e-cigarettes for 24 hours before surgery.  No chewable tobacco products for at least 6 hours before surgery.  No nicotine patches on the day of surgery.  Do not use any "recreational" drugs for at least a week (preferably 2 weeks) before your surgery.  Please be advised that the  combination of cocaine and anesthesia may have negative outcomes, up to and including death. If you test positive for cocaine, your surgery will be cancelled.  On the morning of surgery brush your teeth with toothpaste and water, you may rinse your mouth with mouthwash if you wish. Do not swallow any toothpaste or mouthwash.  Use CHG Soap or wipes as directed on instruction sheet.  Do not wear jewelry, make-up, hairpins, clips or nail polish.  Do not wear lotions, powders, or perfumes.   Do not shave body hair from the neck down 48 hours before surgery.  Contact lenses, hearing aids and dentures may not be worn into surgery.  Do not bring valuables to the hospital. [facility] is not responsible for any missing/lost belongings or valuables.   Notify your doctor if there is any change in your medical condition (cold, fever, infection).  Wear comfortable clothing (specific to your surgery type) to the hospital.  After surgery, you can help prevent lung complications by doing breathing exercises.  Take deep breaths and cough every 1-2 hours. Your doctor may order a device called an Incentive Spirometer to help you take deep breaths. When coughing or sneezing, hold a pillow firmly against your incision with both hands. This is called "splinting." Doing this helps protect your incision. It also decreases belly discomfort.  If you are being admitted to the hospital overnight, leave your suitcase in the car. After surgery it may be brought to your room.  In case of increased patient  census, it may be necessary for you, the patient, to continue your postoperative care in the Same Day Surgery department.  If you are being discharged the day of surgery, you will not be allowed to drive home. You will need a responsible individual to drive you home and stay with you for 24 hours after surgery.   If you are taking public transportation, you will need to have a responsible individual with  you.  Please call the Pre-admissions Testing Dept. at [phone] if you have any questions about these instructions.  Surgery Visitation Policy:  Patients having surgery or a procedure may have two visitors.  Children under the age of 45 must have an adult with them who is not the patient.  Inpatient Visitation:    Visiting hours are 7 a.m. to 8 p.m. Up to four visitors are allowed at one time in a patient room. The visitors may rotate out with other people during the day.  One visitor age 63 or older may stay with the patient overnight and must be in the room by 8 p.m.

## 2022-10-12 LAB — URINE CULTURE

## 2022-10-13 LAB — URINE CULTURE: Culture: 20000 — AB

## 2022-10-23 ENCOUNTER — Encounter
Admission: RE | Disposition: A | Discharge status: Home Health | Payer: Self-pay | Source: Ambulatory Visit | Attending: Neurosurgery

## 2022-10-23 ENCOUNTER — Encounter: Payer: Self-pay | Admitting: Neurosurgery

## 2022-10-23 ENCOUNTER — Other Ambulatory Visit: Payer: Self-pay

## 2022-10-23 ENCOUNTER — Inpatient Hospital Stay: Payer: Medicare Other | Admitting: Urgent Care

## 2022-10-23 ENCOUNTER — Inpatient Hospital Stay: Payer: Medicare Other

## 2022-10-23 ENCOUNTER — Inpatient Hospital Stay
Admission: RE | Admit: 2022-10-23 | Discharge: 2022-10-24 | DRG: 454 | Disposition: A | Discharge status: Home Health | Payer: Medicare Other | Source: Ambulatory Visit | Attending: Neurosurgery | Admitting: Neurosurgery

## 2022-10-23 DIAGNOSIS — Z01812 Encounter for preprocedural laboratory examination: Secondary | ICD-10-CM

## 2022-10-23 DIAGNOSIS — E669 Obesity, unspecified: Secondary | ICD-10-CM | POA: Diagnosis present

## 2022-10-23 DIAGNOSIS — K760 Fatty (change of) liver, not elsewhere classified: Secondary | ICD-10-CM | POA: Diagnosis present

## 2022-10-23 DIAGNOSIS — R7303 Prediabetes: Secondary | ICD-10-CM | POA: Diagnosis present

## 2022-10-23 DIAGNOSIS — G8929 Other chronic pain: Secondary | ICD-10-CM | POA: Diagnosis not present

## 2022-10-23 DIAGNOSIS — Z01818 Encounter for other preprocedural examination: Secondary | ICD-10-CM

## 2022-10-23 DIAGNOSIS — X58XXXA Exposure to other specified factors, initial encounter: Secondary | ICD-10-CM | POA: Diagnosis present

## 2022-10-23 DIAGNOSIS — M810 Age-related osteoporosis without current pathological fracture: Secondary | ICD-10-CM | POA: Diagnosis present

## 2022-10-23 DIAGNOSIS — M5442 Lumbago with sciatica, left side: Secondary | ICD-10-CM

## 2022-10-23 DIAGNOSIS — Z8249 Family history of ischemic heart disease and other diseases of the circulatory system: Secondary | ICD-10-CM | POA: Diagnosis not present

## 2022-10-23 DIAGNOSIS — R8281 Pyuria: Secondary | ICD-10-CM

## 2022-10-23 DIAGNOSIS — E785 Hyperlipidemia, unspecified: Secondary | ICD-10-CM | POA: Diagnosis present

## 2022-10-23 DIAGNOSIS — Z79899 Other long term (current) drug therapy: Secondary | ICD-10-CM | POA: Diagnosis not present

## 2022-10-23 DIAGNOSIS — Z7983 Long term (current) use of bisphosphonates: Secondary | ICD-10-CM

## 2022-10-23 DIAGNOSIS — M4316 Spondylolisthesis, lumbar region: Principal | ICD-10-CM

## 2022-10-23 DIAGNOSIS — I10 Essential (primary) hypertension: Secondary | ICD-10-CM | POA: Diagnosis present

## 2022-10-23 DIAGNOSIS — R8290 Unspecified abnormal findings in urine: Principal | ICD-10-CM

## 2022-10-23 DIAGNOSIS — S32049A Unspecified fracture of fourth lumbar vertebra, initial encounter for closed fracture: Secondary | ICD-10-CM | POA: Diagnosis present

## 2022-10-23 DIAGNOSIS — Z981 Arthrodesis status: Secondary | ICD-10-CM

## 2022-10-23 DIAGNOSIS — Z6836 Body mass index (BMI) 36.0-36.9, adult: Secondary | ICD-10-CM | POA: Diagnosis not present

## 2022-10-23 HISTORY — PX: APPLICATION OF INTRAOPERATIVE CT SCAN: SHX6668

## 2022-10-23 HISTORY — PX: ANTERIOR LATERAL LUMBAR FUSION WITH PERCUTANEOUS SCREW 1 LEVEL: SHX5553

## 2022-10-23 SURGERY — ANTERIOR LATERAL LUMBAR FUSION WITH PERCUTANEOUS SCREW 1 LEVEL
Anesthesia: General | Site: Spine Lumbar

## 2022-10-23 MED ORDER — GLYCOPYRROLATE 0.2 MG/ML IJ SOLN
INTRAMUSCULAR | Status: DC | PRN
  Administered 2022-10-23: .2 mg via INTRAVENOUS

## 2022-10-23 MED ORDER — FENTANYL CITRATE (PF) 100 MCG/2ML IJ SOLN
25.0000 ug | INTRAMUSCULAR | Status: DC | PRN
  Administered 2022-10-23 (×2): 50 ug via INTRAVENOUS

## 2022-10-23 MED ORDER — BISACODYL 10 MG RE SUPP: 10.0000 mg | Freq: Every day | RECTAL | Status: DC | PRN

## 2022-10-23 MED ORDER — POLYETHYLENE GLYCOL 3350 17 G PO PACK: 17.0000 g | PACK | Freq: Every day | ORAL | Status: DC | PRN

## 2022-10-23 MED ORDER — SODIUM CHLORIDE (PF) 0.9 % IJ SOLN
INTRAMUSCULAR | Status: DC | PRN
  Administered 2022-10-23: 60 mL via INTRAMUSCULAR

## 2022-10-23 MED ORDER — ACETAMINOPHEN 10 MG/ML IV SOLN
INTRAVENOUS | Status: AC
  Filled 2022-10-23: qty 100

## 2022-10-23 MED ORDER — EPHEDRINE SULFATE (PRESSORS) 50 MG/ML IJ SOLN
INTRAMUSCULAR | Status: DC | PRN
  Administered 2022-10-23 (×2): 5 mg via INTRAVENOUS
  Administered 2022-10-23: 10 mg via INTRAVENOUS
  Administered 2022-10-23: 5 mg via INTRAVENOUS

## 2022-10-23 MED ORDER — DEXAMETHASONE SODIUM PHOSPHATE 10 MG/ML IJ SOLN
INTRAMUSCULAR | Status: DC | PRN
  Administered 2022-10-23: 10 mg via INTRAVENOUS

## 2022-10-23 MED ORDER — PROPOFOL 1000 MG/100ML IV EMUL
INTRAVENOUS | Status: AC
  Filled 2022-10-23: qty 100

## 2022-10-23 MED ORDER — PHENYLEPHRINE HCL (PRESSORS) 10 MG/ML IV SOLN
INTRAVENOUS | Status: DC | PRN
  Administered 2022-10-23 (×3): 80 ug via INTRAVENOUS
  Administered 2022-10-23: 160 ug via INTRAVENOUS
  Administered 2022-10-23 (×2): 80 ug via INTRAVENOUS

## 2022-10-23 MED ORDER — PHENYLEPHRINE HCL-NACL 20-0.9 MG/250ML-% IV SOLN
INTRAVENOUS | Status: DC | PRN
  Administered 2022-10-23: 26.667 ug/min via INTRAVENOUS

## 2022-10-23 MED ORDER — LIDOCAINE HCL (CARDIAC) PF 100 MG/5ML IV SOSY
PREFILLED_SYRINGE | INTRAVENOUS | Status: DC | PRN
  Administered 2022-10-23: 100 mg via INTRAVENOUS

## 2022-10-23 MED ORDER — VANCOMYCIN HCL IN DEXTROSE 1-5 GM/200ML-% IV SOLN
INTRAVENOUS | Status: AC
  Filled 2022-10-23: qty 200

## 2022-10-23 MED ORDER — KETOROLAC TROMETHAMINE 15 MG/ML IJ SOLN
7.5000 mg | Freq: Four times a day (QID) | INTRAMUSCULAR | Status: AC
  Administered 2022-10-23 – 2022-10-24 (×4): 7.5 mg via INTRAVENOUS
  Filled 2022-10-23 (×4): qty 1

## 2022-10-23 MED ORDER — SODIUM CHLORIDE 0.9% FLUSH
3.0000 mL | Freq: Two times a day (BID) | INTRAVENOUS | Status: DC
  Administered 2022-10-24: 3 mL via INTRAVENOUS

## 2022-10-23 MED ORDER — FAMOTIDINE 20 MG PO TABS
ORAL_TABLET | ORAL | Status: AC
  Filled 2022-10-23: qty 1

## 2022-10-23 MED ORDER — ONDANSETRON HCL 4 MG PO TABS: 4.0000 mg | ORAL_TABLET | Freq: Four times a day (QID) | ORAL | Status: DC | PRN

## 2022-10-23 MED ORDER — ONDANSETRON HCL 4 MG/2ML IJ SOLN
INTRAMUSCULAR | Status: DC | PRN
  Administered 2022-10-23: 4 mg via INTRAVENOUS

## 2022-10-23 MED ORDER — DEXAMETHASONE SODIUM PHOSPHATE 10 MG/ML IJ SOLN
INTRAMUSCULAR | Status: AC
  Filled 2022-10-23: qty 1

## 2022-10-23 MED ORDER — CEFAZOLIN IN SODIUM CHLORIDE 2-0.9 GM/100ML-% IV SOLN
2.0000 g | Freq: Once | INTRAVENOUS | Status: AC
  Administered 2022-10-23: 2 g via INTRAVENOUS

## 2022-10-23 MED ORDER — SURGIRINSE WOUND IRRIGATION SYSTEM - OPTIME
TOPICAL | Status: DC | PRN
  Administered 2022-10-23: 450 mL via TOPICAL

## 2022-10-23 MED ORDER — MENTHOL 3 MG MT LOZG: 1.0000 | LOZENGE | OROMUCOSAL | Status: DC | PRN

## 2022-10-23 MED ORDER — FENTANYL CITRATE (PF) 100 MCG/2ML IJ SOLN
INTRAMUSCULAR | Status: AC
  Filled 2022-10-23: qty 2

## 2022-10-23 MED ORDER — ONDANSETRON HCL 4 MG/2ML IJ SOLN
INTRAMUSCULAR | Status: AC
  Filled 2022-10-23: qty 2

## 2022-10-23 MED ORDER — LACTATED RINGERS IV SOLN: INTRAVENOUS | Status: DC

## 2022-10-23 MED ORDER — SODIUM CHLORIDE FLUSH 0.9 % IV SOLN
INTRAVENOUS | Status: AC
  Filled 2022-10-23: qty 20

## 2022-10-23 MED ORDER — ONDANSETRON HCL 4 MG/2ML IJ SOLN: 4.0000 mg | Freq: Once | INTRAMUSCULAR | Status: DC | PRN

## 2022-10-23 MED ORDER — CHLORHEXIDINE GLUCONATE 0.12 % MT SOLN
OROMUCOSAL | Status: AC
  Filled 2022-10-23: qty 15

## 2022-10-23 MED ORDER — BUPIVACAINE-EPINEPHRINE 0.5% -1:200000 IJ SOLN
INTRAMUSCULAR | Status: DC | PRN
  Administered 2022-10-23: 10 mL
  Administered 2022-10-23: 8 mL

## 2022-10-23 MED ORDER — SURGIFLO WITH THROMBIN (HEMOSTATIC MATRIX KIT) OPTIME
TOPICAL | Status: DC | PRN
  Administered 2022-10-23 (×2): 1 via TOPICAL

## 2022-10-23 MED ORDER — LISINOPRIL 10 MG PO TABS
10.0000 mg | ORAL_TABLET | Freq: Every day | ORAL | Status: DC
  Administered 2022-10-24: 10 mg via ORAL
  Filled 2022-10-23: qty 1

## 2022-10-23 MED ORDER — OXYCODONE HCL 5 MG PO TABS
5.0000 mg | ORAL_TABLET | ORAL | Status: DC | PRN
  Administered 2022-10-23 – 2022-10-24 (×2): 5 mg via ORAL
  Filled 2022-10-23 (×2): qty 1

## 2022-10-23 MED ORDER — ACETAMINOPHEN 500 MG PO TABS
1000.0000 mg | ORAL_TABLET | Freq: Four times a day (QID) | ORAL | Status: DC
  Administered 2022-10-23 – 2022-10-24 (×4): 1000 mg via ORAL
  Filled 2022-10-23 (×5): qty 2

## 2022-10-23 MED ORDER — ACETAMINOPHEN 10 MG/ML IV SOLN: 1000.0000 mg | Freq: Once | INTRAVENOUS | Status: DC | PRN

## 2022-10-23 MED ORDER — SODIUM CHLORIDE 0.9% FLUSH: 3.0000 mL | INTRAVENOUS | Status: DC | PRN

## 2022-10-23 MED ORDER — ATORVASTATIN CALCIUM 10 MG PO TABS
10.0000 mg | ORAL_TABLET | Freq: Every day | ORAL | Status: DC
  Administered 2022-10-23: 10 mg via ORAL
  Filled 2022-10-23: qty 1

## 2022-10-23 MED ORDER — BUPIVACAINE HCL (PF) 0.5 % IJ SOLN
INTRAMUSCULAR | Status: AC
  Filled 2022-10-23: qty 60

## 2022-10-23 MED ORDER — SUCCINYLCHOLINE CHLORIDE 200 MG/10ML IV SOSY
PREFILLED_SYRINGE | INTRAVENOUS | Status: AC
  Filled 2022-10-23: qty 10

## 2022-10-23 MED ORDER — FAMOTIDINE 20 MG PO TABS
20.0000 mg | ORAL_TABLET | Freq: Once | ORAL | Status: AC
  Administered 2022-10-23: 20 mg via ORAL

## 2022-10-23 MED ORDER — LIDOCAINE HCL (PF) 2 % IJ SOLN
INTRAMUSCULAR | Status: AC
  Filled 2022-10-23: qty 5

## 2022-10-23 MED ORDER — 0.9 % SODIUM CHLORIDE (POUR BTL) OPTIME
TOPICAL | Status: DC | PRN
  Administered 2022-10-23: 500 mL

## 2022-10-23 MED ORDER — SODIUM CHLORIDE 0.9 % IV SOLN: 250.0000 mL | INTRAVENOUS | Status: DC

## 2022-10-23 MED ORDER — CEFAZOLIN SODIUM-DEXTROSE 2-4 GM/100ML-% IV SOLN
INTRAVENOUS | Status: AC
  Filled 2022-10-23: qty 100

## 2022-10-23 MED ORDER — PROPOFOL 10 MG/ML IV BOLUS
INTRAVENOUS | Status: DC | PRN
  Administered 2022-10-23: 50 mg via INTRAVENOUS
  Administered 2022-10-23: 30 mg via INTRAVENOUS
  Administered 2022-10-23: 150 mg via INTRAVENOUS

## 2022-10-23 MED ORDER — SENNA 8.6 MG PO TABS
1.0000 | ORAL_TABLET | Freq: Two times a day (BID) | ORAL | Status: DC
  Administered 2022-10-23 – 2022-10-24 (×2): 8.6 mg via ORAL
  Filled 2022-10-23 (×2): qty 1

## 2022-10-23 MED ORDER — PROPOFOL 500 MG/50ML IV EMUL
INTRAVENOUS | Status: DC | PRN
  Administered 2022-10-23: 65 ug/kg/min via INTRAVENOUS

## 2022-10-23 MED ORDER — METHOCARBAMOL 1000 MG/10ML IJ SOLN
500.0000 mg | Freq: Four times a day (QID) | INTRAVENOUS | Status: DC | PRN
  Administered 2022-10-23: 500 mg via INTRAVENOUS
  Filled 2022-10-23: qty 500

## 2022-10-23 MED ORDER — VITAMIN D (ERGOCALCIFEROL) 1.25 MG (50000 UNIT) PO CAPS: 50000.0000 [IU] | ORAL_CAPSULE | ORAL | Status: DC

## 2022-10-23 MED ORDER — EPINEPHRINE PF 1 MG/ML IJ SOLN
INTRAMUSCULAR | Status: AC
  Filled 2022-10-23: qty 1

## 2022-10-23 MED ORDER — ENOXAPARIN SODIUM 60 MG/0.6ML IJ SOSY
0.5000 mg/kg | PREFILLED_SYRINGE | INTRAMUSCULAR | Status: DC
  Administered 2022-10-24: 45 mg via SUBCUTANEOUS
  Filled 2022-10-23: qty 0.6

## 2022-10-23 MED ORDER — PHENOL 1.4 % MT LIQD: 1.0000 | OROMUCOSAL | Status: DC | PRN

## 2022-10-23 MED ORDER — ONDANSETRON HCL 4 MG/2ML IJ SOLN
4.0000 mg | Freq: Four times a day (QID) | INTRAMUSCULAR | Status: DC | PRN
  Administered 2022-10-23 – 2022-10-24 (×2): 4 mg via INTRAVENOUS
  Filled 2022-10-23 (×2): qty 2

## 2022-10-23 MED ORDER — FENTANYL CITRATE (PF) 100 MCG/2ML IJ SOLN
INTRAMUSCULAR | Status: DC | PRN
  Administered 2022-10-23 (×3): 50 ug via INTRAVENOUS

## 2022-10-23 MED ORDER — VANCOMYCIN HCL IN DEXTROSE 1-5 GM/200ML-% IV SOLN
1000.0000 mg | Freq: Once | INTRAVENOUS | Status: AC
  Administered 2022-10-23: 1000 mg via INTRAVENOUS

## 2022-10-23 MED ORDER — OXYCODONE HCL 5 MG PO TABS
5.0000 mg | ORAL_TABLET | Freq: Once | ORAL | Status: AC | PRN
  Administered 2022-10-23: 5 mg via ORAL

## 2022-10-23 MED ORDER — ACETAMINOPHEN 10 MG/ML IV SOLN
INTRAVENOUS | Status: DC | PRN
  Administered 2022-10-23: 1000 mg via INTRAVENOUS

## 2022-10-23 MED ORDER — ORAL CARE MOUTH RINSE: 15.0000 mL | Freq: Once | OROMUCOSAL | Status: AC

## 2022-10-23 MED ORDER — DOCUSATE SODIUM 100 MG PO CAPS
100.0000 mg | ORAL_CAPSULE | Freq: Two times a day (BID) | ORAL | Status: DC
  Administered 2022-10-23 – 2022-10-24 (×2): 100 mg via ORAL
  Filled 2022-10-23 (×2): qty 1

## 2022-10-23 MED ORDER — MAGNESIUM CITRATE PO SOLN: 1.0000 | Freq: Once | ORAL | Status: DC | PRN

## 2022-10-23 MED ORDER — OXYCODONE HCL 5 MG PO TABS
ORAL_TABLET | ORAL | Status: AC
  Filled 2022-10-23: qty 1

## 2022-10-23 MED ORDER — BUPIVACAINE LIPOSOME 1.3 % IJ SUSP
INTRAMUSCULAR | Status: AC
  Filled 2022-10-23: qty 20

## 2022-10-23 MED ORDER — PHENYLEPHRINE HCL-NACL 20-0.9 MG/250ML-% IV SOLN
INTRAVENOUS | Status: AC
  Filled 2022-10-23: qty 250

## 2022-10-23 MED ORDER — OXYCODONE HCL 5 MG PO TABS
10.0000 mg | ORAL_TABLET | ORAL | Status: DC | PRN
  Administered 2022-10-23: 10 mg via ORAL
  Filled 2022-10-23: qty 2

## 2022-10-23 MED ORDER — METHOCARBAMOL 500 MG PO TABS
500.0000 mg | ORAL_TABLET | Freq: Four times a day (QID) | ORAL | Status: DC | PRN
  Administered 2022-10-23 – 2022-10-24 (×2): 500 mg via ORAL
  Filled 2022-10-23 (×2): qty 1

## 2022-10-23 MED ORDER — CHLORHEXIDINE GLUCONATE 0.12 % MT SOLN
15.0000 mL | Freq: Once | OROMUCOSAL | Status: AC
  Administered 2022-10-23: 15 mL via OROMUCOSAL

## 2022-10-23 MED ORDER — MAGNESIUM OXIDE 400 MG PO TABS
400.0000 mg | ORAL_TABLET | Freq: Every day | ORAL | Status: DC
  Administered 2022-10-24: 400 mg via ORAL
  Filled 2022-10-23 (×2): qty 1

## 2022-10-23 MED ORDER — OXYCODONE HCL 5 MG/5ML PO SOLN: 5.0000 mg | Freq: Once | ORAL | Status: AC | PRN

## 2022-10-23 MED ORDER — SUCCINYLCHOLINE CHLORIDE 200 MG/10ML IV SOSY
PREFILLED_SYRINGE | INTRAVENOUS | Status: DC | PRN
  Administered 2022-10-23: 90 mg via INTRAVENOUS

## 2022-10-23 MED ORDER — SODIUM CHLORIDE 0.9 % IV SOLN: INTRAVENOUS | Status: DC

## 2022-10-23 SURGICAL SUPPLY — 98 items
ADH SKN CLS APL DERMABOND .7 (GAUZE/BANDAGES/DRESSINGS) ×3
AGENT HMST KT MTR STRL THRMB (HEMOSTASIS) ×1
ALLOGRAFT BONESTRIP KORE 2.5X5 (Bone Implant) IMPLANT
APL PRP STRL LF DISP 70% ISPRP (MISCELLANEOUS) ×2
BASIN KIT SINGLE STR (MISCELLANEOUS) ×1 IMPLANT
BUR NEURO DRILL SOFT 3.0X3.8M (BURR) IMPLANT
CEMENT MAX VISCOSITY NUVA (Cement) IMPLANT
CHLORAPREP W/TINT 26 (MISCELLANEOUS) ×2 IMPLANT
CORD BIP STRL DISP 12FT (MISCELLANEOUS) ×1 IMPLANT
CORD LIGHT LATERIAL X LIFT (MISCELLANEOUS) IMPLANT
COVER BACK TABLE REUSABLE LG (DRAPES) ×1 IMPLANT
COVERAGE SUPP BRAINLAB NG SPNE (MISCELLANEOUS) IMPLANT
COVERAGE SUPPORT SPINE BRAINLB (MISCELLANEOUS)
CUP MEDICINE 2OZ PLAST GRAD ST (MISCELLANEOUS) ×1 IMPLANT
DERMABOND ADVANCED .7 DNX12 (GAUZE/BANDAGES/DRESSINGS) ×3 IMPLANT
DRAPE C ARM PK CFD 31 SPINE (DRAPES) ×1 IMPLANT
DRAPE C-ARMOR (DRAPES) IMPLANT
DRAPE INCISE IOBAN 66X45 STRL (DRAPES) ×1 IMPLANT
DRAPE LAPAROTOMY 100X77 ABD (DRAPES) ×2 IMPLANT
DRAPE MICROSCOPE SPINE 48X150 (DRAPES) IMPLANT
DRAPE POUCH INSTRU U-SHP 10X18 (DRAPES) ×1 IMPLANT
DRAPE SCAN PATIENT (DRAPES) ×1 IMPLANT
DRSG OPSITE POSTOP 4X10 (GAUZE/BANDAGES/DRESSINGS) IMPLANT
DRSG OPSITE POSTOP 4X6 (GAUZE/BANDAGES/DRESSINGS) IMPLANT
DRSG TEGADERM 2-3/8X2-3/4 SM (GAUZE/BANDAGES/DRESSINGS) IMPLANT
DRSG TEGADERM 4X4.75 (GAUZE/BANDAGES/DRESSINGS) IMPLANT
DRSG TEGADERM 6X8 (GAUZE/BANDAGES/DRESSINGS) IMPLANT
ELECT CAUTERY BLADE TIP 2.5 (TIP) ×1
ELECT EZSTD 165MM 6.5IN (MISCELLANEOUS) ×1
ELECT REM PT RETURN 9FT ADLT (ELECTROSURGICAL) ×2
ELECTRODE CAUTERY BLDE TIP 2.5 (TIP) ×1 IMPLANT
ELECTRODE EZSTD 165MM 6.5IN (MISCELLANEOUS) ×1 IMPLANT
ELECTRODE REM PT RTRN 9FT ADLT (ELECTROSURGICAL) ×2 IMPLANT
EX-PIN ORTHOLOCK NAV 4X150 (PIN) IMPLANT
FEE CVG SUPP BRAINLAB NG SPNE (MISCELLANEOUS) IMPLANT
FEE INTRAOP CADWELL SUPPLY NCS (MISCELLANEOUS) ×1 IMPLANT
FEE INTRAOP MONITOR IMPULS NCS (MISCELLANEOUS) ×1 IMPLANT
FORCEPS BPLR BAYO 10IN 1.0TIP (ORTHOPEDIC DISPOSABLE SUPPLIES) IMPLANT
Fenestrated Needle & Duster IMPLANT
GAUZE 4X4 16PLY ~~LOC~~+RFID DBL (SPONGE) IMPLANT
GAUZE SPONGE 2X2 STRL 8-PLY (GAUZE/BANDAGES/DRESSINGS) IMPLANT
GLOVE BIOGEL PI IND STRL 6.5 (GLOVE) ×3 IMPLANT
GLOVE SURG SYN 6.5 ES PF (GLOVE) ×5 IMPLANT
GLOVE SURG SYN 6.5 PF PI (GLOVE) ×5 IMPLANT
GLOVE SURG SYN 8.5  E (GLOVE) ×6
GLOVE SURG SYN 8.5 E (GLOVE) ×6 IMPLANT
GLOVE SURG SYN 8.5 PF PI (GLOVE) ×6 IMPLANT
GOWN SRG LRG LVL 4 IMPRV REINF (GOWNS) ×2 IMPLANT
GOWN SRG XL LVL 3 NONREINFORCE (GOWNS) ×2 IMPLANT
GOWN STRL NON-REIN TWL XL LVL3 (GOWNS) ×2
GOWN STRL REIN LRG LVL4 (GOWNS) ×2
HOLDER FOLEY CATH W/STRAP (MISCELLANEOUS) ×1 IMPLANT
INTRAOP CADWELL SUPPLY FEE NCS (MISCELLANEOUS) ×1
INTRAOP DISP SUPPLY FEE NCS (MISCELLANEOUS) ×1
INTRAOP MONITOR FEE IMPULS NCS (MISCELLANEOUS) ×1
INTRAOP MONITOR FEE IMPULSE (MISCELLANEOUS) ×1
KIT DILATOR XLIF 5 (KITS) IMPLANT
KIT DISP MARS 3V (KITS) IMPLANT
KIT SPINAL PRONEVIEW (KITS) ×1 IMPLANT
KIT TURNOVER KIT A (KITS) ×1 IMPLANT
KNIFE BAYONET SHORT DISCETOMY (MISCELLANEOUS) IMPLANT
MANIFOLD NEPTUNE II (INSTRUMENTS) ×2 IMPLANT
MARKER SKIN DUAL TIP RULER LAB (MISCELLANEOUS) ×2 IMPLANT
MARKER SPHERE PSV REFLC 13MM (MARKER) ×7 IMPLANT
MODULE NVM5 NEXT GEN EMG (NEEDLE) IMPLANT
NDL AND PUSHER RELINE MAS (NEEDLE) IMPLANT
NDL SAFETY ECLIP 18X1.5 (MISCELLANEOUS) ×1 IMPLANT
NDLE AND PUSHER RELINE MAS (NEEDLE) ×2
PACK LAMINECTOMY NEURO (CUSTOM PROCEDURE TRAY) ×1 IMPLANT
PAD ARMBOARD 7.5X6 YLW CONV (MISCELLANEOUS) ×1 IMPLANT
PENCIL SMOKE EVACUATOR (MISCELLANEOUS) ×1 IMPLANT
ROD RELINE MAS LORD 5.5X45MM (Rod) IMPLANT
ROD RELINE MAS TI LORD 5.5X40 (Rod) IMPLANT
SCREW LOCK RELINE 5.5 TULIP (Screw) IMPLANT
SCREW RELINE PA 6.5X45 (Screw) IMPLANT
SCREW SELF DRILL VARIABLE 30MM (Screw) IMPLANT
SOLUTION IRRIG SURGIPHOR (IV SOLUTION) ×1 IMPLANT
SPACER ELSA 8-17 20DX50 (Spacer) IMPLANT
SPACER HEDRON 10D 18X45X15 (Spacer) IMPLANT
STAPLER SKIN PROX 35W (STAPLE) IMPLANT
SURGIFLO W/THROMBIN 8M KIT (HEMOSTASIS) ×1 IMPLANT
SUT DVC VLOC 3-0 CL 6 P-12 (SUTURE) ×1 IMPLANT
SUT ETHILON 3-0 FS-10 30 BLK (SUTURE)
SUT VIC AB 0 CT1 18XCR BRD 8 (SUTURE) IMPLANT
SUT VIC AB 0 CT1 27 (SUTURE) ×1
SUT VIC AB 0 CT1 27XCR 8 STRN (SUTURE) ×1 IMPLANT
SUT VIC AB 0 CT1 8-18 (SUTURE) ×2
SUT VIC AB 2-0 CT1 18 (SUTURE) ×1 IMPLANT
SUTURE EHLN 3-0 FS-10 30 BLK (SUTURE) IMPLANT
SYR 30ML LL (SYRINGE) ×2 IMPLANT
TAPE CLOTH 3X10 WHT NS LF (GAUZE/BANDAGES/DRESSINGS) ×4 IMPLANT
TIP FENS REPLACE RELINE (MISCELLANEOUS) IMPLANT
TOWEL OR 17X26 4PK STRL BLUE (TOWEL DISPOSABLE) ×2 IMPLANT
TRAP FLUID SMOKE EVACUATOR (MISCELLANEOUS) ×1 IMPLANT
TRAY FOLEY SLVR 16FR LF STAT (SET/KITS/TRAYS/PACK) IMPLANT
TUBING CONNECTING 10 (TUBING) ×1 IMPLANT
WATER STERILE IRR 1000ML POUR (IV SOLUTION) ×2 IMPLANT
WATER STERILE IRR 500ML POUR (IV SOLUTION) IMPLANT

## 2022-10-23 NOTE — Anesthesia Procedure Notes (Signed)
Procedure Name: Intubation Date/Time: 10/23/2022 9:40 AM  Performed by: Renee Ramus, CRNAPre-anesthesia Checklist: Patient identified, Emergency Drugs available, Suction available and Patient being monitored Patient Re-evaluated:Patient Re-evaluated prior to induction Oxygen Delivery Method: Circle system utilized Preoxygenation: Pre-oxygenation with 100% oxygen Induction Type: IV induction Ventilation: Mask ventilation without difficulty Laryngoscope Size: McGraph and 3 Grade View: Grade II Tube type: Oral Tube size: 6.5 mm Number of attempts: 1 Airway Equipment and Method: Stylet and Oral airway Placement Confirmation: ETT inserted through vocal cords under direct vision, positive ETCO2 and breath sounds checked- equal and bilateral Secured at: 22 cm Tube secured with: Tape Dental Injury: Teeth and Oropharynx as per pre-operative assessment

## 2022-10-23 NOTE — Anesthesia Preprocedure Evaluation (Addendum)
Anesthesia Evaluation  Patient identified by MRN, date of birth, ID band Patient awake    Reviewed: Allergy & Precautions, NPO status , Patient's Chart, lab work & pertinent test results  History of Anesthesia Complications Negative for: history of anesthetic complications  Airway Mallampati: I   Neck ROM: Full    Dental no notable dental hx.    Pulmonary neg pulmonary ROS   Pulmonary exam normal breath sounds clear to auscultation       Cardiovascular hypertension, Normal cardiovascular exam Rhythm:Regular Rate:Normal  ECG 10/11/22:  Normal sinus rhythm Nonspecific T wave abnormality   Neuro/Psych negative neurological ROS     GI/Hepatic hiatal hernia,,,NAFLD   Endo/Other  Obesity; prediabetes  Renal/GU negative Renal ROS     Musculoskeletal  (+) Arthritis ,    Abdominal   Peds  Hematology negative hematology ROS (+)   Anesthesia Other Findings   Reproductive/Obstetrics                             Anesthesia Physical Anesthesia Plan  ASA: 2  Anesthesia Plan: General   Post-op Pain Management:    Induction: Intravenous  PONV Risk Score and Plan: 3 and Ondansetron, Dexamethasone and Treatment may vary due to age or medical condition  Airway Management Planned: Oral ETT  Additional Equipment:   Intra-op Plan:   Post-operative Plan: Extubation in OR  Informed Consent: I have reviewed the patients History and Physical, chart, labs and discussed the procedure including the risks, benefits and alternatives for the proposed anesthesia with the patient or authorized representative who has indicated his/her understanding and acceptance.     Dental advisory given  Plan Discussed with: CRNA  Anesthesia Plan Comments: (Patient consented for risks of anesthesia including but not limited to:  - adverse reactions to medications - damage to eyes, teeth, lips or other oral  mucosa - nerve damage due to positioning  - sore throat or hoarseness - damage to heart, brain, nerves, lungs, other parts of body or loss of life  Informed patient about role of CRNA in peri- and intra-operative care.  Patient voiced understanding.)        Anesthesia Quick Evaluation

## 2022-10-23 NOTE — Transfer of Care (Signed)
Immediate Anesthesia Transfer of Care Note  Patient: Taylor Morrow  Procedure(s) Performed: L4-5 LATERAL LUMBAR INTERBODY FUSION AND POSTERIOR SPINAL FUSION (Spine Lumbar) APPLICATION OF INTRAOPERATIVE CT SCAN (Spine Lumbar)  Patient Location: PACU  Anesthesia Type:General  Level of Consciousness: sedated  Airway & Oxygen Therapy: Patient Spontanous Breathing and Patient connected to face mask oxygen  Post-op Assessment: Report given to RN and Post -op Vital signs reviewed and stable  Post vital signs: Reviewed and stable  Last Vitals:  Vitals Value Taken Time  BP 98/57 10/23/22 1309  Temp    Pulse 64 10/23/22 1313  Resp 20 10/23/22 1313  SpO2 99 % 10/23/22 1313  Vitals shown include unvalidated device data.  Last Pain:  Vitals:   10/23/22 0808  TempSrc: Temporal  PainSc: 0-No pain         Complications: No notable events documented.

## 2022-10-23 NOTE — Anesthesia Postprocedure Evaluation (Signed)
Anesthesia Post Note  Patient: Taylor Morrow  Procedure(s) Performed: L4-5 LATERAL LUMBAR INTERBODY FUSION AND POSTERIOR SPINAL FUSION (Spine Lumbar) APPLICATION OF INTRAOPERATIVE CT SCAN (Spine Lumbar)  Patient location during evaluation: PACU Anesthesia Type: General Level of consciousness: awake and alert, oriented and patient cooperative Pain management: pain level controlled Vital Signs Assessment: post-procedure vital signs reviewed and stable Respiratory status: spontaneous breathing, nonlabored ventilation and respiratory function stable Cardiovascular status: blood pressure returned to baseline and stable Postop Assessment: adequate PO intake Anesthetic complications: no   No notable events documented.   Last Vitals:  Vitals:   10/23/22 1315 10/23/22 1320  BP: 107/63   Pulse: 60 65  Resp: 19 13  Temp: (!) 35.9 C   SpO2: 98% 98%    Last Pain:  Vitals:   10/23/22 1320  TempSrc:   PainSc: Asleep                 Reed Breech

## 2022-10-23 NOTE — Progress Notes (Addendum)
Error - see other note from today

## 2022-10-23 NOTE — Op Note (Signed)
Indications: Ms. 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   Preoperative Note:   Risks of surgery discussed include: infection, bleeding, stroke, coma, death, paralysis, CSF leak, nerve/spinal cord injury, numbness, tingling, weakness, complex regional pain syndrome, recurrent stenosis and/or disc herniation, vascular injury, development of instability, neck/back pain, need for further surgery, persistent symptoms, development of deformity, and the risks of anesthesia. The patient understood these risks and agreed to proceed.  NAME OF ANTERIOR PROCEDURE:               1. Anterior lumbar interbody fusion via a right lateral retroperitoneal approach at L4/5 2. Placement of a Lordotic Globus Elsa interbody cage, filled with Demineralized Bone Matrix  NAME OF POSTERIOR PROCEDURE 1. Posterior instrumentation using Nuvasive Reline Instrumentation 2. Posterolateral fusion, L4/5, utilizing demineralized bone matrix and cement supplementation 3. Use of Stereotaxis   PROCEDURE:  Patient was brought to the operating room, intubated, turned to the lateral position.  All pressure points were checked and double-checked.  The patient was prepped and draped in the standard fashion. Prior to prepping, fluoroscopy was brought in and the patient was positioned with a large bump under the contralateral side between the iliac crest and rib cage, allowing the area between the iliac crest and the lateral aspect of the rib cage to  open and increase the ability to reach inferiorly, to facilitate entry into the disc space.  The incision was marked upon the skin both the location of the disc space as well as the superior most aspect of the iliac crest.  Based on the identification of the disc space an incision was prepared, marked upon the skin and eventually was used for our lateral incision.  The fluoroscopy was turned into a cross table A/P image in order to confirm that the patient's spine remained in a perpendicular trajectory to the floor without rotation.  Once confirming that all the pressure points were checked and double-checked and the patient remained in sturdy position strapped down in this slightly jack-knifed lateral position, the patient was prepped and draped in standard fashion.  The skin was injected with local anesthetic, then incised until the abdominal wall fascia was noted.  I bluntly dissected posteriorly until we were able to identify the posterior musculature near petit's triangle.  At this point, using primarily blunt dissection with our finger aided with a metzenbaum scissor, were able to enter the retroperitoneal cavity.  The retroperitoneal potential space was opened further until palpating out the psoas muscle, the medial aspect of the iliac crest, the medial aspect of the last rib and continued to define the retroperitoneal space with blunt dissection in order to facilitate safe placement of our dilators.    While protecting by dissecting directly onto a finger in the retroperitoneum, the retroperitoneal space was entered safely from the lateral incision and the initial dilator placed onto the muscle belly of the psoas.  While directly stimulating the dilator and after radiographically confirming our location relative to the disc space, I placed the dilator through the psoas.  The dilators were stimulated to ensure remaining safely away from any of the lumbar plexus nerves; the dilators were repositioned until  no pathologic stimulation was appreciated.  Once I had confirmed the location of our initial dilator radiographically, a K-wire secured the dilator into the L4/5 disc space and confirmed position under A/P and lateral fluoroscopy.  At this point, I dilated up with direct stimulation to confirm lack of pathologic stimulation.  Once all the dilators were in position, I placed in the retractor and secured it onto the table, locked into position and confirmed under A/P and lateral fluoroscopy to confirm our approach angle to the disc space as well as location relative to the disc space.  I then placed the muscle stimulator in through the working channel down to the vertebral body, stimulating the entire lateral surface of the vertebral body and any of the visualized psoas muscle that was adjacent to the retractor, confirming again the safe passage to the psoas before we began performing the discectomy.  At this point, we began our discectomy at L4/5.  The disc was incised laterally throughout the extent of our exposure. Using a combination of pituitary rongeurs, Kerrison rongeurs, rasps, curettes of various sorts, we were able to begin to clean out the disc space.  Once we had cleaned out the majority of the disc space, we then cut the lateral annulus with a cob, breaking the lateral annual attachments on the contralateral side by subtly working the cob through the annulus while using flouroscopy.  Care was taken not to extend further than required after cutting the annular attachments.  After this had been performed, we prepared the endplates for placement of our graft, sized a graft to the disc space by serially dilating up in trial sizes until we confirmed that our graft would be well positioned, allowing distraction while maintaining good grip.  This was confirmed under A/P and lateral fluoroscopy in order to ensure its placement as an eventual trial for placement of our final graft.  We irrigated with  bacteriostatic saline.  Once confirmed placement, the Hedron implant filled with allograft was impacted into position at L4/5.   When checking the final lateral radiograph, there was a fracture in the anterior portion of the L4 inferior endplate.  This caused the interbody to rotate.  The retractor was repositioned and the interbody removed.  Additional disc prep was performed to allow for a more posterior placement of an Elsa device.  The globus Elsia device was positioned in place and secured to the L4 bone.  It was expanded.   At this point, final radiographs were performed, and we began closure.  The wound was closed using 0 Vicryl interrupted suture in the fascia and 2-0 Vicryl inverted suture were placed in the subcutaneous tissue and dermis. 3-0 monocryl was used for final closure. Dermabond was used to close the skin.    After closing the anterior part in layers, the patient was repositioned into prone position.  All pressure points were checked and double-checked.  The posterior operative site was prepped and draped in standard fashion.  The stereotactic array was placed.  Stereotactic images were acquired using intraoperative CT scanning.  This was registered to the patient.  Using stereotaxis, screw trajectories were planned and incisions made.  The pedicles from L4 to L5 were cannulated bilaterally and K wires used to secure the tracks.  We then utilized a stereotactic screwdriver to place pedicle screws from L4 to L5.  At each level,  Nuvasive Reline pedicle screws were placed.  We utilized fenestrated screws and were able to cement augment the L4 and L5 screws on the right and the left L5 screw.  Once the screws were placed, the screw extensions were then linked, a path was formed for the rod and a rod was utilized to connect the screws.  We then compressed, torqued / counter-torqued and removed the screw assembly. Once performed on each side, the C-arm was brought back and to take  confirmatory CT scan showing appropriate placement of all instrumentation and anatomic alignment.    Posterolateral arthrodesis was performed at L4-L5 utilizing demineralized bone matrix.  Again we confirmed radiographically and began our closure.  The wound was closed using 0 Vicryl interrupted suture in the fascia, 2-0 Vicryl inverted suture were placed in the subcutaneous tissue and dermis. 3-0 monocryl was used for final closure. Dermabond was used to close the skin.    Needle, lap and all counts were correct at the end of the case.    There was no pathologic change in the neuromonitoring during the procedure.   Manning Charity PA assisted in the entire procedure. An assistant was required for this procedure due to the complexity.  The assistant provided assistance in tissue manipulation and suction, and was required for the successful and safe performance of the procedure. I performed the critical portions of the procedure.   Venetia Night MD Neurosurgery

## 2022-10-23 NOTE — H&P (Signed)
Referring Physician:  Venetia Night, MD [address] [place],  [place] [address]  Primary Physician:  Oneal Grout, FNP  History of Present Illness: 10/23/2022 Taylor Morrow is a 77 yo female who has continued symptoms as noted below.  09/19/2022 Taylor Morrow is here today with a chief complaint of primarily left leg pain.  She also has back pain, but her leg pain is worse.  She has been having worsening pain over several years although it is only recently that she is now unable to walk for any distance.  Standing and walking make her pain worse.  Sitting down makes it better.  She has severe pain into her left heel at times.  She has tried and failed conservative management.  Bowel/Bladder Dysfunction: none  Conservative measures:  Physical therapy:  has participated in at [facility] and was discharged 3/7/4 Multimodal medical therapy including regular antiinflammatories:   aleve, tramadol, tylenol, robaxin Injections:  has received epidural steroid injections   06/20/2022: Left L5-S1 transforaminal ESI (dexamethasone 13 mg) 05/01/2022: Left L5-S1 transforaminal ESI (65% relief, dexamethasone 10 mg) 01/06/2022: Left L5-S1 transforaminal ESI (60% relief) 03/31/2021: Right L5-S1 transforaminal ESI (90% relief) 07/21/2020: Right L5-S1 transforaminal ESI (good relief) 12/24/2018: Right L5-S1 transforaminal ESI (good relief) 11/20/2018: Right S1 transforaminal ESI (moderate to good relief x2 weeks) 04/02/2018: Right S1 transforaminal ESI (good relief) 02/01/2018: Right S1 transforaminal ESI (good relief of lower extremity pain)   Past Surgery:  previous lumbar surgeries ~30 years ago    Taylor Morrow has no symptoms of cervical myelopathy.   The symptoms are causing a significant impact on the patient's life.   Seen Taylor Hora PA  09/07/2022  History of Present Illness: 09/07/22 Taylor Morrow is a 77 y.o presenting today via telephone visit to again  discuss her symptoms. She reports increased low back pain and some intermittent groin pain which is new. She is now heavily relying on a wheelchair due to her pain.    08/24/22 Taylor Morrow is a 77 y.o presenting today via telephone visit for discussion of worsening symptoms, review of updated MRI and discussion after completing PT. She has completed PT. Unfortunately despite conservative management she continues to have back and left leg pain that are significantly impacting her quality of life. She states she has lost about 5 lbs since her last visit and it hoping to be approved to start Ozempic.   06/29/2022 Taylor Morrow is a 77 y.o with a history of obesity, hyperlipidemia, osteoporosis on Fosamax, hypertension, and chronic lumbosacral complaints who here today for further evaluation of low back pain that radiates into her left buttock and down the posterior lateral aspect of her left leg and into her foot.  She states is been going on for several years however is worsened over the last year making it difficult for her to participate in her daily activities.  She states she is only able to stand or walk for several minutes before having to sit down and rest.  It makes it difficult for her to do her dishes as she does not have a dishwasher.  She denies any similar right-sided symptoms although she has had these in the past.  Despite having undergone multiple injections, her symptoms continue to persist and are affecting her quality of life.   Conservative measures:  Physical therapy: None within the last year Multimodal medical therapy including regular antiinflammatories: Aleve, tramadol, and acetaminophen   Injections:  06/20/2022: Left L5-S1  transforaminal ESI (dexamethasone 13 mg) 05/01/2022: Left L5-S1 transforaminal ESI (65% relief, dexamethasone 10 mg) 01/06/2022: Left L5-S1 transforaminal ESI (60% relief) 03/31/2021: Right L5-S1 transforaminal ESI (90% relief) 07/21/2020: Right L5-S1  transforaminal ESI (good relief) 12/24/2018: Right L5-S1 transforaminal ESI (good relief) 11/20/2018: Right S1 transforaminal ESI (moderate to good relief x2 weeks) 04/02/2018: Right S1 transforaminal ESI (good relief) 02/01/2018: Right S1 transforaminal ESI (good relief of lower extremity pain)      I have utilized the care everywhere function in epic to review the outside records available from external health systems.  Review of Systems:  A 10 point review of systems is negative, except for the pertinent positives and negatives detailed in the HPI.  Past Medical History: Past Medical History:  Diagnosis Date   Arthritis    Colon polyp    Fatty liver disease, nonalcoholic    GI bleed    History of hiatal hernia    Hypertension    Obesity    Pre-diabetes    Prediabetes    Vitamin D deficiency     Past Surgical History: Past Surgical History:  Procedure Laterality Date   BACK SURGERY     x 2   COLONOSCOPY WITH PROPOFOL N/A 10/27/2015   Procedure: COLONOSCOPY WITH PROPOFOL;  Surgeon: Scot Jun, MD;  Location: [facility] ENDOSCOPY;  Service: Endoscopy;  Laterality: N/A;   COLONOSCOPY WITH PROPOFOL N/A 04/07/2019   Procedure: COLONOSCOPY WITH PROPOFOL;  Surgeon: Toledo, Boykin Nearing, MD;  Location: [facility] ENDOSCOPY;  Service: Gastroenterology;  Laterality: N/A;   FOOT SURGERY Bilateral    right 2013  left  2014   HERNIA REPAIR     2007   ROTATOR CUFF REPAIR Right    THYROID SURGERY      Allergies: Allergies as of 09/25/2022   (No Known Allergies)    Medications: Current Meds  Medication Sig   Acetaminophen (TYLENOL ARTHRITIS EXT RELIEF PO) Take 650 mg by mouth every 6 (six) hours as needed (pain).   Apoaequorin (PREVAGEN PO) Take 1 tablet by mouth daily.   atorvastatin (LIPITOR) 10 MG tablet Take 10 mg by mouth daily.   lisinopril (PRINIVIL,ZESTRIL) 10 MG tablet Take 10 mg by mouth daily.   methocarbamol (ROBAXIN) 500 MG tablet Take 1 tablet (500 mg total) by mouth 2  (two) times daily as needed for muscle spasms.   naproxen sodium (ALEVE) 220 MG tablet Take 220 mg by mouth 2 (two) times daily as needed (pain).   traMADol (ULTRAM) 50 MG tablet Take 50 mg by mouth every 6 (six) hours as needed for moderate pain.    Social History: Social History   Tobacco Use   Smoking status: Never   Smokeless tobacco: Never  Substance Use Topics   Alcohol use: No    Alcohol/week: 0.0 standard drinks of alcohol   Drug use: No    Family Medical History: Family History  Problem Relation Age of Onset   Hypertension Mother    Prostate cancer Neg Hx    Kidney cancer Neg Hx    Bladder Cancer Neg Hx     Physical Examination: Vitals:   10/23/22 0808  BP: 136/81  Pulse: 78  Resp: 16  Temp: 97.7 F (36.5 C)  SpO2: 99%   Heart sounds normal no MRG. Chest Clear to Auscultation Bilaterally.  General: Patient is well developed, well nourished, calm, collected, and in no apparent distress. Attention to examination is appropriate.  Neck:   Supple.  Full range of motion.  Respiratory: Patient  is breathing without any difficulty.   NEUROLOGICAL:     Awake, alert, oriented to person, place, and time.  Speech is clear and fluent.   Cranial Nerves: Pupils equal round and reactive to light.  Facial tone is symmetric.  Facial sensation is symmetric. Shoulder shrug is symmetric. Tongue protrusion is midline.  There is no pronator drift.  ROM of spine: full.    Strength: Side Biceps Triceps Deltoid Interossei Grip Wrist Ext. Wrist Flex.  R L Side Iliopsoas Quads Hamstring PF DF EHL  R L Reflexes are 1+ and symmetric at the biceps, triceps, brachioradialis, patella and achilles.   Hoffman's is absent.   Bilateral upper and lower extremity sensation is intact to light touch.    No evidence of dysmetria noted.  Gait is abnormal-she requires a wheelchair due to inability to walk distance.      Medical Decision Making  Imaging: MRI L spine 08/18/2022 IMPRESSION: 1. Multilevel lumbar spondylosis as described above, worst at L4-L5 where there is progressive now moderate to severe spinal canal and severe left neuroforaminal stenosis.     Electronically Signed   By: Obie Dredge M.D.   On: 08/18/2022 13:42  I have personally reviewed the images and agree with the above interpretation.  She also has an anterolisthesis of L4 and L5.  Assessment and Plan: Ms. Kinzie is a pleasant 77 y.o. 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  She has low back pain with left-sided sciatica.  She has tried and failed conservative management.  No further conservative management is indicated.  I recommended surgical intervention. We will proceed with L4-5 lateral lumbar interbody fusion with posterior fixation.     Roshawnda Pecora K. Myer Haff MD, [facility] Neurosurgery

## 2022-10-24 MED ORDER — ONDANSETRON HCL 4 MG PO TABS: 4.0000 mg | ORAL_TABLET | Freq: Four times a day (QID) | ORAL | 0 refills | Status: DC | PRN

## 2022-10-24 MED ORDER — METHOCARBAMOL 500 MG PO TABS: 500.0000 mg | ORAL_TABLET | Freq: Four times a day (QID) | ORAL | 0 refills | Status: DC | PRN

## 2022-10-24 MED ORDER — SENNA 8.6 MG PO TABS: 1.0000 | ORAL_TABLET | Freq: Every day | ORAL | 0 refills | Status: DC | PRN

## 2022-10-24 MED ORDER — OXYCODONE HCL 5 MG PO TABS: 5.0000 mg | ORAL_TABLET | ORAL | 0 refills | Status: AC | PRN

## 2022-10-24 NOTE — Evaluation (Signed)
Occupational Therapy Evaluation Patient Details Name: Taylor Morrow MRN: [id number] DOB: 12/03/1945 Today's Date: 10/24/2022   History of Present Illness Taylor Morrow is a 77 y/o female s/p L4-5 XLIF on 10/23/22. PMH includes arthritis, HTN, vitamin D deficiency, GI bleed, obesity.   Clinical Impression   Patient agreeable to OT evaluation. Pt presenting with decreased independence in self care, balance, functional mobility/transfers, endurance. PTA pt lived with family, was independent for ADLs/IADLs, and Mod I for functional mobility using an AD. Pt currently functioning at [facility] guard for simulated toilet transfer, set up A for seated LB dressing, and Min guard for clothing management in standing. Education provided re: log roll technique for bed mobility, no BLT, and LB dressing AE. Pt reports her daughter will be available 24/7 upon discharge to assist with self-care tasks as needed. Pt is close to baseline level of function with ADLs. Will keep on OT caseload to continue education on back precautions, energy conservation, and work on implementing precautions during ADL tasks. No follow up OT recommended at D/C.      Recommendations for follow up therapy are one component of a multi-disciplinary discharge planning process, led by the attending physician.  Recommendations may be updated based on patient status, additional functional criteria and insurance authorization.   Assistance Recommended at Discharge Intermittent Supervision/Assistance  Patient can return home with the following A little help with walking and/or transfers;A little help with bathing/dressing/bathroom;Assistance with cooking/housework;Assist for transportation;Help with stairs or ramp for entrance    Functional Status Assessment  Patient has had a recent decline in their functional status and demonstrates the ability to make significant improvements in function in a reasonable and predictable amount of time.   Equipment Recommendations  Other (comment) Lexicographer)    Recommendations for Other Services       Precautions / Restrictions Precautions Precautions: Back;Fall Precaution Comments: log roll Required Braces or Orthoses: Spinal Brace Spinal Brace:  (LSO) Restrictions Weight Bearing Restrictions: No Other Position/Activity Restrictions: May remove when in bed, may ambulate to bathroom without brace, apply/remove brace in sitting, may remove brace to shower      Mobility Bed Mobility               General bed mobility comments: NT, pt received/left in recliner    Transfers Overall transfer level: Needs assistance Equipment used: None Transfers: Sit to/from Stand Sit to Stand: Min guard           General transfer comment: Min guard for safety      Balance Overall balance assessment: Needs assistance Sitting-balance support: Feet supported Sitting balance-Leahy Scale: Normal     Standing balance support: No upper extremity supported, During functional activity Standing balance-Leahy Scale: Fair         ADL either performed or assessed with clinical judgement   ADL Overall ADL's : Needs assistance/impaired     Grooming: Set up;Sitting               Lower Body Dressing: Set up;Sitting/lateral leans;With adaptive equipment;Cueing for compensatory techniques;Min guard Lower Body Dressing Details (indicate cue type and reason): set up A to don/doff B socks and underwear in sitting, use of AE to maintain back precautions, Min guard for safety to hike underwear in standing Toilet Transfer: Min Pension scheme manager Details (indicate cue type and reason): simulated with STS from recliner, no AD Toileting- Clothing Manipulation and Hygiene: Min guard;Sit to/from stand Toileting - Clothing Manipulation Details (indicate cue type and reason): for safety  General ADL Comments: Pt endorsed nausea at rest and in standing, deferred functional mobility to  the bathroom this date.     Vision Baseline Vision/History: 1 Wears glasses (reading) Patient Visual Report: No change from baseline       Perception     Praxis      Pertinent Vitals/Pain Pain Assessment Pain Assessment: Faces Faces Pain Scale: Hurts a little bit Pain Location: back, R hip Pain Descriptors / Indicators: Aching, Sore Pain Intervention(s): Monitored during session, Repositioned, Limited activity within patient's tolerance     Hand Dominance Right   Extremity/Trunk Assessment Upper Extremity Assessment Upper Extremity Assessment: Overall WFL for tasks assessed   Lower Extremity Assessment Lower Extremity Assessment: Overall WFL for tasks assessed   Cervical / Trunk Assessment Cervical / Trunk Assessment: Back Surgery   Communication Communication Communication: No difficulties   Cognition Arousal/Alertness: Awake/alert Behavior During Therapy: WFL for tasks assessed/performed Overall Cognitive Status: Within Functional Limits for tasks assessed         General Comments       Exercises Other Exercises Other Exercises: OT provided education re: role of OT, OT POC, post acute recs, sitting up for all meals, EOB/OOB mobility with assistance, home/fall safety, no BLT, log roll technique for bed mobility, LB dressing AE (reacher, sock aid)   Shoulder Instructions      Home Living Family/patient expects to be discharged to:: Private residence Living Arrangements: Children (daughter and son in law staying with pt) Available Help at Discharge: Family;Available 24 hours/day (daughter taking several days off to assist at D/C per pt) Type of Home: House Home Access: Stairs to enter [employer] of Steps: 1 (1 small threshold with R rail at [employer]; 2 stairs in den with B rails) Entrance Stairs-Rails: Right Home Layout: One level     Bathroom Shower/Tub: Producer, television/film/video: Handicapped height (comfort height)     Home  Equipment: Other (comment);Rolling Walker (2 wheels);Rollator (4 wheels);Grab bars - tub/shower;Shower seat (transport chair, removable bed rail)          Prior Functioning/Environment Prior Level of Function : Driving;Independent/Modified Independent             Mobility Comments: Pt reports using transport chair recently due to pain, Mod I with RW ADLs Comments: Pt reports independent for ADLs/IADLs, still driving        OT Problem List: Decreased strength;Decreased activity tolerance;Impaired balance (sitting and/or standing);Pain      OT Treatment/Interventions: Self-care/ADL training;Therapeutic exercise;Energy conservation;DME and/or AE instruction;Therapeutic activities;Patient/family education;Balance training    OT Goals(Current goals can be found in the care plan section) Acute Rehab OT Goals Patient Stated Goal: return home OT Goal Formulation: With patient Time For Goal Achievement: 11/07/22 Potential to Achieve Goals: Good   OT Frequency: Min 1X/week    Co-evaluation              AM-PAC OT "6 Clicks" Daily Activity     Outcome Measure Help from another person eating meals?: None Help from another person taking care of personal grooming?: None Help from another person toileting, which includes using toliet, bedpan, or urinal?: A Little Help from another person bathing (including washing, rinsing, drying)?: A Little Help from another person to put on and taking off regular upper body clothing?: None Help from another person to put on and taking off regular lower body clothing?: A Little 6 Click Score: 21   End of Session Nurse Communication: Mobility status;Other (comment) (pt requests meds for  nausea)  Activity Tolerance: Patient tolerated treatment well Patient left: in chair;with call bell/phone within reach  OT Visit Diagnosis: Other abnormalities of gait and mobility (R26.89);Pain;Muscle weakness (generalized) (M62.81) Pain - Right/Left:  Right Pain - part of body: Hip (back)                Time: 1610-9604 OT Time Calculation (min): 15 min Charges:  OT General Charges $OT Visit: 1 Visit OT Evaluation $OT Eval Low Complexity: 1 Low  [facility] MS, OTR/L ascom [phone]  10/24/22, 11:52 AM

## 2022-10-24 NOTE — Progress Notes (Signed)
Orthopedic Tech Progress Note Patient Details:  Taylor Morrow 10/27/1945 [id number]  RN called this morning needing a BACK BRACE, that was supposed to be ordered yesterday, so I called in order to [facility]  Patient ID: Taylor Morrow, female   DOB: [birth year], 77 y.o.   MRN: [id number]  [name] 10/24/2022, 11:48 AM

## 2022-10-24 NOTE — Evaluation (Signed)
Physical Therapy Evaluation Patient Details Name: Taylor Morrow MRN: [id number] DOB: December 27, 1945 Today's Date: 10/24/2022  History of Present Illness  Taylor Morrow is a 77 y/o female s/p L4-5 XLIF on 10/23/22. PMH includes arthritis, HTN, vitamin D deficiency, GI bleed, obesity.   Clinical Impression  Patient alert, sitting EOB, oriented x4. Pt stated at baseline she has her daughter to assist at baseline, modI with RW or transport chair. She was able to perform sit <> stand with RW and CGA-supervision, cued for hand placement. She ambulated ~132ft with RW and CGA, steady and gait WFLs, pt reported no radiating R leg pain (chronic issue prior to surgery). She also performed stair navigation with CGA.  Overall the patient demonstrated deficits (see "PT Problem List") that impede the patient's functional abilities, safety, and mobility and would benefit from skilled PT intervention. Recommendation is continued skilled PT intervention to maximize function and safety.        Recommendations for follow up therapy are one component of a multi-disciplinary discharge planning process, led by the attending physician.  Recommendations may be updated based on patient status, additional functional criteria and insurance authorization.  Follow Up Recommendations       Assistance Recommended at Discharge Frequent or constant Supervision/Assistance  Patient can return home with the following  Assistance with cooking/housework;Assist for transportation;Direct supervision/assist for medications management;Help with stairs or ramp for entrance    Equipment Recommendations None recommended by PT  Recommendations for Other Services       Functional Status Assessment Patient has had a recent decline in their functional status and demonstrates the ability to make significant improvements in function in a reasonable and predictable amount of time.     Precautions / Restrictions  Precautions Precautions: Back;Fall Precaution Comments: log roll Required Braces or Orthoses: Spinal Brace Spinal Brace:  (LSO) Restrictions Weight Bearing Restrictions: No Other Position/Activity Restrictions: May remove when in bed, may ambulate to bathroom without brace, apply/remove brace in sitting, may remove brace to shower      Mobility  Bed Mobility               General bed mobility comments: pt sitting EOB at start of session    Transfers Overall transfer level: Needs assistance Equipment used: Rolling walker (2 wheels) Transfers: Sit to/from Stand Sit to Stand: Supervision           General transfer comment: cued for hand placement    Ambulation/Gait Ambulation/Gait assistance: Supervision, Min guard Gait Distance (Feet): 150 Feet Assistive device: Rolling walker (2 wheels)         General Gait Details: pt reported more comfort with RW use, steady, no LOB, gait WFLs  Stairs Stairs: Yes Stairs assistance: Min guard Stair Management: Two rails, Step to pattern Number of Stairs: 4    Wheelchair Mobility    Modified Rankin (Stroke Patients Only)       Balance Overall balance assessment: Needs assistance Sitting-balance support: Feet supported Sitting balance-Leahy Scale: Normal     Standing balance support: No upper extremity supported, During functional activity Standing balance-Leahy Scale: Fair                               Pertinent Vitals/Pain Pain Assessment Pain Assessment: 0-10 Pain Score: 1  Pain Location: back, R hip Pain Descriptors / Indicators: Aching, Sore Pain Intervention(s): Monitored during session, Premedicated before session, Limited activity within patient's tolerance    Home  Living Family/patient expects to be discharged to:: Private residence Living Arrangements: Children (daughter and son in law staying with pt) Available Help at Discharge: Family;Available 24 hours/day (daughter taking  several days off to assist at D/C per pt) Type of Home: House Home Access: Stairs to enter Entrance Stairs-Rails: Right Entrance Stairs-Number of Steps: 1 (1 small threshold with R rail at kitchen; 2 stairs in den with B rails)   Home Layout: One level Home Equipment: Other (comment);Rolling Walker (2 wheels);Rollator (4 wheels);Grab bars - tub/shower;Shower seat (transport chair, removable bed rail)      Prior Function Prior Level of Function : Driving;Independent/Modified Independent             Mobility Comments: Pt reports using transport chair recently due to pain, Mod I with RW ADLs Comments: Pt reports independent for ADLs/IADLs, still driving     Hand Dominance   Dominant Hand: Right    Extremity/Trunk Assessment   Upper Extremity Assessment Upper Extremity Assessment: Overall WFL for tasks assessed    Lower Extremity Assessment Lower Extremity Assessment: Overall WFL for tasks assessed    Cervical / Trunk Assessment Cervical / Trunk Assessment: Back Surgery  Communication   Communication: No difficulties  Cognition Arousal/Alertness: Awake/alert Behavior During Therapy: WFL for tasks assessed/performed Overall Cognitive Status: Within Functional Limits for tasks assessed                                          General Comments      Exercises     Assessment/Plan    PT Assessment Patient needs continued PT services  PT Problem List Decreased strength;Decreased mobility;Decreased activity tolerance;Decreased range of motion;Pain       PT Treatment Interventions DME instruction;Therapeutic activities;Therapeutic exercise;Gait training;Patient/family education;Stair training;Balance training;Functional mobility training;Neuromuscular re-education    PT Goals (Current goals can be found in the Care Plan section)  Acute Rehab PT Goals Patient Stated Goal: to go home PT Goal Formulation: With patient Time For Goal Achievement:  11/07/22 Potential to Achieve Goals: Good    Frequency 7X/week     Co-evaluation               AM-PAC PT "6 Clicks" Mobility  Outcome Measure Help needed turning from your back to your side while in a flat bed without using bedrails?: None Help needed moving from lying on your back to sitting on the side of a flat bed without using bedrails?: None Help needed moving to and from a bed to a chair (including a wheelchair)?: None Help needed standing up from a chair using your arms (e.g., wheelchair or bedside chair)?: None Help needed to walk in hospital room?: None Help needed climbing 3-5 steps with a railing? : A Little 6 Click Score: 23    End of Session Equipment Utilized During Treatment: Gait belt Activity Tolerance: Patient tolerated treatment well Patient left: in chair;with call bell/phone within reach Nurse Communication: Mobility status PT Visit Diagnosis: Other abnormalities of gait and mobility (R26.89);Pain Pain - Right/Left:  (midline) Pain - part of body:  (back)    Time: 0981-1914 PT Time Calculation (min) (ACUTE ONLY): 14 min   Charges:   PT Evaluation $PT Eval Low Complexity: 1 Low PT Treatments $Therapeutic Activity: 8-22 mins        Olga Coaster PT, DPT 11:55 AM,10/24/22

## 2022-10-24 NOTE — Progress Notes (Signed)
DISCHARGE NOTE:    Pt discharged with personal belongings bag and IV removed. Discharge paperwork reviewed with pt and pt's daughter. No further questions or concerns. Back brace delivered and educated on how to put on. Pt wheeled down to medical mall entrance and daughter provided transportation.

## 2022-10-24 NOTE — TOC Initial Note (Signed)
Transition of Care [facility]) - Initial/Assessment Note    Patient Details  Name: Taylor Morrow MRN: [id number] Date of Birth: Jul 31, 1945  [facility] CM/SW Contact:    Marlowe Sax, RN Phone Number: 10/24/2022, 10:37 AM  Clinical Narrative:                 The patient is set up prior to surgery by surgeons office with [facility] Therapy will work with the patient and make recommendations, TOC to continue to follow the patient and assist with DC planning and needs  Expected Discharge Plan: Home w Home Health Services Barriers to Discharge: Continued Medical Work up   Patient Goals and CMS Choice            Expected Discharge Plan and Services   Discharge Planning Services: CM Consult Post Acute Care Choice: Home Health Living arrangements for the past 2 months: Single Family Home                           HH Arranged: PT HH Agency: [facility] Date [facility] Agency Contacted: 10/24/22 Time HH Agency Contacted: 1035 Representative spoke with at [facility] Agency: Amy  Prior Living Arrangements/Services Living arrangements for the past 2 months: Single Family Home   Patient language and need for interpreter reviewed:: Yes Do you feel safe going back to the place where you live?: Yes      Need for Family Participation in Patient Care: Yes (Comment) Care giver support system in place?: Yes (comment)   Criminal Activity/Legal Involvement Pertinent to Current Situation/Hospitalization: No - Comment as needed  Activities of Daily Living Home Assistive Devices/Equipment: Walker (specify type) ADL Screening (condition at time of admission) Patient's cognitive ability adequate to safely complete daily activities?: Yes Is the patient deaf or have difficulty hearing?: No Does the patient have difficulty seeing, even when wearing glasses/contacts?: No Does the patient have difficulty concentrating, remembering, or making decisions?: No Patient able to  express need for assistance with ADLs?: Yes Does the patient have difficulty dressing or bathing?: No Independently performs ADLs?: Yes (appropriate for developmental age) Does the patient have difficulty walking or climbing stairs?: No Weakness of Legs: Left Weakness of Arms/Hands: None  Permission Sought/Granted   Permission granted to share information with : Yes, Verbal Permission Granted              Emotional Assessment Appearance:: Appears stated age Attitude/Demeanor/Rapport: Engaged Affect (typically observed): Pleasant Orientation: : Oriented to Self, Oriented to Place, Oriented to  Time, Oriented to Situation Alcohol / Substance Use: Not Applicable Psych Involvement: No (comment)  Admission diagnosis:  S/P lumbar fusion [Z98.1] Patient Active Problem List   Diagnosis Date Noted   S/P lumbar fusion 10/23/2022   Spondylolisthesis of lumbar region 10/23/2022   Chronic bilateral low back pain with left-sided sciatica 10/23/2022   Lumbar spondylosis 01/04/2018   Fatty liver disease, nonalcoholic 05/03/2017   Chondrocalcinosis 03/23/2017   Trochanteric bursitis, left hip 03/23/2017   Tear of medial cartilage or meniscus of knee, current 03/23/2017   GI bleed 08/05/2015   Rectal bleeding 08/05/2015   Impingement syndrome of right shoulder 10/15/2014   Right supraspinatus tenosynovitis 09/16/2014   Complete tear of right rotator cuff 09/16/2014   PCP:  Oneal Grout, FNP Pharmacy:   [facility] - [place], [place] - [address] [place] [address] Phone: [phone] Fax: [phone]  [facility] [phone] -  [place], [place] - [address] AT [facility] [address] [place] [address] Phone: [phone] Fax: [phone]  [id number] - [place], [place] - [address] AT [facility] [address] [place] [address] Phone:  [phone] Fax: [phone]     Social Determinants of Health (SDOH) Social History: SDOH Screenings   Food Insecurity: No Food Insecurity (10/23/2022)  Housing: Low Risk  (10/23/2022)  Transportation Needs: No Transportation Needs (10/23/2022)  Utilities: Not At Risk (10/23/2022)  Tobacco Use: Low Risk  (10/23/2022)   SDOH Interventions:     Readmission Risk Interventions     No data to display

## 2022-10-24 NOTE — Progress Notes (Addendum)
Progress Note  History: Taylor Morrow is s/p L4-5 XLIF   POD1: some right incisional flank discomfort this morning.  And pain with hip flexion.  Patient reports significant improvement of her preoperative left leg pain.  Physical Exam: Vitals:   10/24/22 0510 10/24/22 0742  BP: 131/72 130/66  Pulse: 88 88  Resp: 20 17  Temp: 98.1 F (36.7 C)   SpO2: 93% 97%    AA Ox3 CNI  Strength:5/5 throughout BLE except 4-/5 right HF Incisions covered with operative bandages.  Data:  Other tests/results: none  Assessment/Plan:  Taylor Morrow is a 77 y.o presenting with spondylolisthesis and left sided sciatica s/p L4-5 XLIF and PSF  - mobilize - pain control - DVT prophylaxis - PTOT; dispo planning underway.  Manning Charity PA-C Department of Neurosurgery

## 2022-10-24 NOTE — Plan of Care (Signed)
  Problem: Education: Goal: Ability to verbalize activity precautions or restrictions will improve Outcome: Progressing   Problem: Activity: Goal: Ability to avoid complications of mobility impairment will improve Outcome: Progressing   Problem: Bowel/Gastric: Goal: Gastrointestinal status for postoperative course will improve Outcome: Progressing   Problem: Clinical Measurements: Goal: Ability to maintain clinical measurements within normal limits will improve Outcome: Progressing   Problem: Pain Management: Goal: Pain level will decrease Outcome: Progressing   Problem: Health Behavior/Discharge Planning: Goal: Identification of resources available to assist in meeting health care needs will improve Outcome: Progressing   Problem: Education: Goal: Knowledge of General Education information will improve Description: Including pain rating scale, medication(s)/side effects and non-pharmacologic comfort measures Outcome: Progressing

## 2022-10-24 NOTE — Discharge Summary (Signed)
Discharge Summary  Patient ID: Taylor Morrow MRN: [id number] DOB/AGE: 1945/09/08 77 y.o.  Admit date: 10/23/2022 Discharge date: 10/24/2022  Admission Diagnoses: Spondylolisthesis of lumbar region - M43.16 , Chronic bilateral low back pain with left-sided sciatica - M54.42, G89.29   Discharge Diagnoses:  Principal Problem:   S/P lumbar fusion Active Problems:   Spondylolisthesis of lumbar region   Chronic bilateral low back pain with left-sided sciatica   Discharged Condition: good  Hospital Course:  Taylor Morrow is a very 77 year old female presenting with chronic lumbosacral pain and left-sided sciatica.  She underwent a L4-5 XLIF and PSF on 10/23/22.  Her intraoperative course was uncomplicated and she was admitted overnight for therapy evaluation and pain control.  She did exceptionally well postoperatively with complete resolution of her radiating left leg pain.  She was seen by physical therapy and deemed appropriate for discharge home.  She was discharged home on postop day 1 with prescriptions for oxycodone, Robaxin, senna, and Zofran.  Consults: None  Significant Diagnostic Studies: none  Treatments: surgery: as above.  Please see separately dictated operative report for further details.  Discharge Exam: Blood pressure 130/66, pulse 88, temperature 98.6 F (37 C), temperature source Axillary, resp. rate 17, height  (1.575 m), weight 90.7 kg, SpO2 97 %. AA Ox3 CNI   Strength:5/5 throughout BLE except 4-/5 right HF Incisions covered with operative bandages.  Disposition: Discharge disposition: 01-Home or Self Care       Discharge Instructions     Diet - low sodium heart healthy   Complete by: As directed    Incentive spirometry RT   Complete by: As directed       Allergies as of 10/24/2022   No Known Allergies      Medication List     STOP taking these medications    naproxen sodium 220 MG tablet Commonly known as: ALEVE   traMADol 50  MG tablet Commonly known as: ULTRAM       TAKE these medications    alendronate 70 MG tablet Commonly known as: FOSAMAX Take 70 mg by mouth once a week. Take with a full glass of water on an empty stomach. On wednesday   ALIVE WOMENS 50+ GUMMY PO Take 1 tablet by mouth daily.   atorvastatin 10 MG tablet Commonly known as: LIPITOR Take 10 mg by mouth daily.   calcium carbonate 1250 (500 Ca) MG tablet Commonly known as: OS-CAL - dosed in mg of elemental calcium Take 1 tablet by mouth 2 (two) times daily with a meal.   D3 2000 PO Take 1 tablet by mouth daily.   ergocalciferol 1.25 MG (50000 UT) capsule Commonly known as: VITAMIN D2 Take 50,000 Units by mouth once a week. saturdays   lisinopril 10 MG tablet Commonly known as: ZESTRIL Take 10 mg by mouth daily.   magnesium oxide 400 MG tablet Commonly known as: MAG-OX Take 400 mg by mouth daily.   methocarbamol 500 MG tablet Commonly known as: ROBAXIN Take 1 tablet (500 mg total) by mouth every 6 (six) hours as needed for muscle spasms. What changed: when to take this   ondansetron 4 MG tablet Commonly known as: ZOFRAN Take 1 tablet (4 mg total) by mouth every 6 (six) hours as needed for nausea or vomiting.   oxyCODONE 5 MG immediate release tablet Commonly known as: Oxy IR/ROXICODONE Take 1-2 tablets (5-10 mg total) by mouth every 4 (four) hours as needed for up to 5 days for moderate pain or  severe pain ((score 4 to 6)).   PREVAGEN PO Take 1 tablet by mouth daily.   senna 8.6 MG Tabs tablet Commonly known as: SENOKOT Take 1 tablet (8.6 mg total) by mouth daily as needed for mild constipation.   TYLENOL ARTHRITIS EXT RELIEF PO Take 650 mg by mouth every 6 (six) hours as needed (pain).        Follow-up Information     Susanne Borders, [place]. Go on 11/07/2022.   Specialty: Neurosurgery Why: Appt @ 2:30 pm Contact information: [address] [place] [address] [phone]                  Signed: Susanne Borders 10/24/2022, 2:54 PM

## 2022-10-24 NOTE — Discharge Instructions (Signed)
  Your surgeon has performed an operation on your lumbar spine (low back) to relieve pressure on one or more nerves. Many times, patients feel better immediately after surgery and can "overdo it." Even if you feel well, it is important that you follow these activity guidelines. If you do not let your back heal properly from the surgery, you can increase the chance of hardware complications and/or return of your symptoms. The following are instructions to help in your recovery once you have been discharged from the hospital.  Do not use NSAIDs for 3 months after surgery.   Activity    No bending, lifting, or twisting ("BLT"). Avoid lifting objects heavier than 10 pounds (gallon milk jug).  Where possible, avoid household activities that involve lifting, bending, pushing, or pulling such as laundry, vacuuming, grocery shopping, and childcare. Try to arrange for help from friends and family for these activities while your back heals.  Increase physical activity slowly as tolerated.  Taking short walks is encouraged, but avoid strenuous exercise. Do not jog, run, bicycle, lift weights, or participate in any other exercises unless specifically allowed by your doctor. Avoid prolonged sitting, including car rides.  Talk to your doctor before resuming sexual activity.  You should not drive until cleared by your doctor.  Until released by your doctor, you should not return to work or school.  You should rest at home and let your body heal.   You may shower three days after your surgery.  After showering, lightly dab your incision dry. Do not take a tub bath or go swimming for 3 weeks, or until approved by your doctor at your follow-up appointment.  If you smoke, we strongly recommend that you quit.  Smoking has been proven to interfere with normal healing in your back and will dramatically reduce the success rate of your surgery. Please contact [facility] (800-QUIT-NOW) and use the resources at  www.[facility].com for assistance in stopping smoking.  Surgical Incision   If you have a dressing on your incision, you may remove it three days after your surgery. Keep your incision area clean and dry.  Your incision was closed with Dermabond glue. The glue should begin to peel away within about a week.  Diet            You may return to your usual diet. Be sure to stay hydrated.  When to Contact Us  Although your surgery and recovery will likely be uneventful, you may have some residual numbness, aches, and pains in your back and/or legs. This is normal and should improve in the next few weeks.  However, should you experience any of the following, contact us immediately: New numbness or weakness Pain that is progressively getting worse, and is not relieved by your pain medications or rest Bleeding, redness, swelling, pain, or drainage from surgical incision Chills or flu-like symptoms Fever greater than 101.0 F (38.3 C) Problems with bowel or bladder functions Difficulty breathing or shortness of breath Warmth, tenderness, or swelling in your calf  Contact Information During office hours (Monday-Friday 9 am to 5 pm), please call your physician at [phone] and ask for Kendelyn Jean After hours and weekends, please call [phone] and speak with the neurosurgeon on call For a life-threatening emergency, call 911 

## 2022-10-25 ENCOUNTER — Telehealth: Payer: Self-pay | Admitting: Neurosurgery

## 2022-10-25 MED ORDER — METHYLPREDNISOLONE 4 MG PO TBPK: ORAL_TABLET | ORAL | 0 refills | Status: DC

## 2022-10-25 NOTE — Telephone Encounter (Signed)
Had surgery on Monday 04/22, says she is having a throbbing cramp like pain in left leg. Has taken oxycodone for pain with little to no relief. Is also taking the muscle relaxer.  Also wanted to note that she wet the bed last night but is unsure if that is related or not. No other "accidents."  Is up and moving.   CB: [phone]

## 2022-10-25 NOTE — Telephone Encounter (Signed)
Called patient advised a steroid taper was sent to pharmacy and she should be feeling better once she takes medications. Patient understood and was thankful

## 2022-10-26 ENCOUNTER — Encounter: Payer: Self-pay | Admitting: Neurosurgery

## 2022-10-31 ENCOUNTER — Telehealth: Payer: Self-pay | Admitting: Neurosurgery

## 2022-10-31 NOTE — Telephone Encounter (Signed)
Called patient and advised that Taylor Morrow stated :  This is likely just her nerves healing. I would not be overly concerned about this.  Patient was okay with this response

## 2022-10-31 NOTE — Telephone Encounter (Signed)
L4-5 XLIF/PSF on 10/23/22  Patient is calling that she has numbness and some swelling from the top of her right knee down into her foot. Tingling feeling on the front part of her leg not behind. No pain. She took the last prednisone yesterday. Should be be worried?

## 2022-11-07 ENCOUNTER — Encounter: Payer: Self-pay | Admitting: Neurosurgery

## 2022-11-07 ENCOUNTER — Ambulatory Visit (INDEPENDENT_AMBULATORY_CARE_PROVIDER_SITE_OTHER): Payer: Medicare Other | Admitting: Neurosurgery

## 2022-11-07 VITALS — BP 130/82 | Temp 98.4°F | Ht 62.0 in | Wt 200.0 lb

## 2022-11-07 DIAGNOSIS — M5442 Lumbago with sciatica, left side: Secondary | ICD-10-CM

## 2022-11-07 DIAGNOSIS — M4316 Spondylolisthesis, lumbar region: Secondary | ICD-10-CM

## 2022-11-07 DIAGNOSIS — G8929 Other chronic pain: Secondary | ICD-10-CM

## 2022-11-07 DIAGNOSIS — Z09 Encounter for follow-up examination after completed treatment for conditions other than malignant neoplasm: Secondary | ICD-10-CM

## 2022-11-07 DIAGNOSIS — Z981 Arthrodesis status: Secondary | ICD-10-CM

## 2022-11-07 MED ORDER — OXYCODONE HCL 5 MG PO TABS: 5.0000 mg | ORAL_TABLET | Freq: Two times a day (BID) | ORAL | 0 refills | Status: AC | PRN

## 2022-11-07 NOTE — Progress Notes (Signed)
   REFERRING PHYSICIAN:  Oneal Grout, Fnp [address] [place],  [place] [address]  DOS: 10/23/22 L4-5 XLIF and PSF  HISTORY OF PRESENT ILLNESS: Taylor Morrow is approximately 2 weeks status post lumbar fusion. she is doing relatively well post-operatively. She did have an episode of left leg pain requiring a dose of steroids.  This improved her symptoms. Today she reports doing well .  She continues to have some numbness in her right leg as well as some pain in her back.  She is currently taking oxycodone once or twice every couple of days.  She continues to take Robaxin.  She denies any new or worsening symptoms.  Overall she is pleased with her postoperative recovery thus far!  PHYSICAL EXAMINATION:  General: Patient is well developed, well nourished, calm, collected, and in no apparent distress.   NEUROLOGICAL:  General: In no acute distress.   Awake, alert, oriented to person, place, and time.  Pupils equal round and reactive to light.  Strength:            Side Iliopsoas Quads Hamstring PF DF EHL  R 5 5 5 5 5 5   L 5 5 5 5 5 5    Incisions c/d/I and healing well   ROS (Neurologic):  Negative except as noted above  IMAGING: No interval imaging to review  ASSESSMENT/PLAN:  Taylor Morrow is doing very well approximately 2 weeks after lumbar fusion.  I have sent in a refill of her oxycodone at her request at a reduced frequency.  We discussed activity escalation and I have advised the patient to lift up to 10 pounds until 6 weeks after surgery, then increase up to 25 pounds until 12 weeks after surgery.  After 12 weeks post-op, the patient advised to increase activity as tolerated. she will follow up in 4 weeks with Dr. Myer Haff or sooner should she have any questions or concerns.  Advised to contact the office if any questions or concerns arise.  Manning Charity PA-C Department of neurosurgery

## 2022-11-21 ENCOUNTER — Encounter: Payer: Self-pay | Admitting: Emergency Medicine

## 2022-11-21 ENCOUNTER — Emergency Department
Admission: EM | Admit: 2022-11-21 | Discharge: 2022-11-21 | Disposition: A | Discharge status: Home / Self Care | Payer: Medicare Other | Attending: Emergency Medicine | Admitting: Emergency Medicine

## 2022-11-21 ENCOUNTER — Other Ambulatory Visit: Payer: Self-pay

## 2022-11-21 DIAGNOSIS — N390 Urinary tract infection, site not specified: Secondary | ICD-10-CM | POA: Insufficient documentation

## 2022-11-21 DIAGNOSIS — I10 Essential (primary) hypertension: Secondary | ICD-10-CM | POA: Insufficient documentation

## 2022-11-21 DIAGNOSIS — R112 Nausea with vomiting, unspecified: Secondary | ICD-10-CM

## 2022-11-21 LAB — COMPREHENSIVE METABOLIC PANEL
ALT: 13 U/L (ref 0–44)
AST: 16 U/L (ref 15–41)
Albumin: 3.5 g/dL (ref 3.5–5.0)
Alkaline Phosphatase: 107 U/L (ref 38–126)
Anion gap: 7 (ref 5–15)
BUN: 14 mg/dL (ref 8–23)
CO2: 25 mmol/L (ref 22–32)
Calcium: 8.9 mg/dL (ref 8.9–10.3)
Chloride: 105 mmol/L (ref 98–111)
Creatinine, Ser: 0.83 mg/dL (ref 0.44–1.00)
GFR, Estimated: >60 mL/min (ref >60)
Glucose, Bld: 119 mg/dL — ABNORMAL HIGH (ref 70–99)
Potassium: 3.9 mmol/L (ref 3.5–5.1)
Sodium: 137 mmol/L (ref 135–145)
Total Bilirubin: 1 mg/dL (ref 0.3–1.2)
Total Protein: 6.4 g/dL — ABNORMAL LOW (ref 6.5–8.1)

## 2022-11-21 LAB — URINALYSIS, ROUTINE W REFLEX MICROSCOPIC
Bilirubin Urine: NEGATIVE
Glucose, UA: NEGATIVE mg/dL
Hgb urine dipstick: NEGATIVE
Ketones, ur: 5 mg/dL — AB
Nitrite: POSITIVE — AB
Protein, ur: NEGATIVE mg/dL
Specific Gravity, Urine: 1.014 (ref 1.005–1.030)
pH: 6 (ref 5.0–8.0)

## 2022-11-21 LAB — CBC
HCT: 36.3 % (ref 36.0–46.0)
Hemoglobin: 11 g/dL — ABNORMAL LOW (ref 12.0–15.0)
MCH: 28.1 pg (ref 26.0–34.0)
MCHC: 30.3 g/dL (ref 30.0–36.0)
MCV: 92.6 fL (ref 80.0–100.0)
Platelets: 249 10*3/uL (ref 150–400)
RBC: 3.92 MIL/uL (ref 3.87–5.11)
RDW: 14.3 % (ref 11.5–15.5)
WBC: 9.3 10*3/uL (ref 4.0–10.5)
nRBC: 0 % (ref 0.0–0.2)

## 2022-11-21 LAB — LIPASE, BLOOD: Lipase: 26 U/L (ref 11–51)

## 2022-11-21 MED ORDER — ONDANSETRON HCL 4 MG/2ML IJ SOLN
4.0000 mg | Freq: Once | INTRAMUSCULAR | Status: AC
  Administered 2022-11-21: 4 mg via INTRAVENOUS
  Filled 2022-11-21: qty 2

## 2022-11-21 MED ORDER — ONDANSETRON 8 MG PO TBDP: 8.0000 mg | ORAL_TABLET | Freq: Three times a day (TID) | ORAL | 0 refills | Status: DC | PRN

## 2022-11-21 MED ORDER — CEFDINIR 300 MG PO CAPS: 300.0000 mg | ORAL_CAPSULE | Freq: Two times a day (BID) | ORAL | 0 refills | Status: AC

## 2022-11-21 MED ORDER — SODIUM CHLORIDE 0.9 % IV BOLUS
1000.0000 mL | Freq: Once | INTRAVENOUS | Status: AC
  Administered 2022-11-21: 1000 mL via INTRAVENOUS

## 2022-11-21 NOTE — ED Provider Notes (Signed)
[facility] Provider Note   Event Date/Time   First MD Initiated Contact with Patient 11/21/22 [phone]     (approximate) History  Nausea  HPI Taylor Morrow is a 77 y.o. female with stated past medical history of hypertension, hyperlipidemia, and recent back surgery on 10/23/2022 who presents complaining of nausea/vomiting/diarrhea that began 24 hours prior to arrival and states this is the third occurrence of the similar symptoms that have been present to other times since her back surgery on 4/22.  Patient does endorse change in urine color and smell however denies any dysuria.  Patient denies any recent travel or sick contacts.  Patient denies any new or changed medications ROS: Patient currently denies any vision changes, tinnitus, difficulty speaking, facial droop, sore throat, chest pain, shortness of breath, abdominal pain, dysuria, or weakness/numbness/paresthesias in any extremity   Physical Exam  Triage Vital Signs: ED Triage Vitals  Enc Vitals Group     BP 11/21/22 0808 129/84     Pulse Rate 11/21/22 0808 78     Resp 11/21/22 0808 16     Temp 11/21/22 0808 98.4 F (36.9 C)     Temp Source 11/21/22 0808 Oral     SpO2 11/21/22 0808 97 %     Weight 11/21/22 0809 199 lb 15.3 oz (90.7 kg)     Height 11/21/22 0809 5\' 2"  (1.575 m)     Head Circumference --      Peak Flow --      Pain Score 11/21/22 0809 4     Pain Loc --      Pain Edu? --      Excl. in GC? --    Most recent vital signs: Vitals:   11/21/22 0808  BP: 129/84  Pulse: 78  Resp: 16  Temp: 98.4 F (36.9 C)  SpO2: 97%   General: Awake, oriented x4. CV:  Good peripheral perfusion.  Resp:  Normal effort.  Abd:  No distention.  Other:  Elderly obese Caucasian female laying in stretcher in no acute distress. ED Results / Procedures / Treatments  Labs (all labs ordered are listed, but only abnormal results are displayed) Labs Reviewed  COMPREHENSIVE METABOLIC PANEL - Abnormal;  Notable for the following components:      Result Value   Glucose, Bld 119 (*)    Total Protein 6.4 (*)    All other components within normal limits  CBC - Abnormal; Notable for the following components:   Hemoglobin 11.0 (*)    All other components within normal limits  URINALYSIS, ROUTINE W REFLEX MICROSCOPIC - Abnormal; Notable for the following components:   Color, Urine YELLOW (*)    APPearance HAZY (*)    Ketones, ur 5 (*)    Nitrite POSITIVE (*)    Leukocytes,Ua SMALL (*)    Bacteria, UA MANY (*)    All other components within normal limits  LIPASE, BLOOD   PROCEDURES: Critical Care performed: No .1-3 Lead EKG Interpretation  Performed by: Merwyn Katos, MD Authorized by: Merwyn Katos, MD     Interpretation: normal     ECG rate:  71   ECG rate assessment: normal     Rhythm: sinus rhythm     Ectopy: none     Conduction: normal    MEDICATIONS ORDERED IN ED: Medications  ondansetron (ZOFRAN) injection 4 mg (4 mg Intravenous Given 11/21/22 0942)  sodium chloride 0.9 % bolus 1,000 mL (1,000 mLs Intravenous New Bag/Given 11/21/22 0941)  IMPRESSION / MDM / ASSESSMENT AND PLAN / ED COURSE  I reviewed the triage vital signs and the nursing notes.                             The patient is on the cardiac monitor to evaluate for evidence of arrhythmia and/or significant heart rate changes. Patient's presentation is most consistent with acute presentation with potential threat to life or bodily function. Patient presents for nausea/vomiting/diarrhea with some urinary changes over the past 2 days.  Patient is p.o. tolerant Not Pregnant. Unlikely TOA, Ovarian Torsion, PID, gonorrhea/chlamydia. Low suspicion for Infected Urolithiasis, AAA, Cholecystitis, Pancreatitis, SBO, Appendicitis, or other acute abdomen.  Rx: Cefdinir 300 mg BID for 5 days, Zofran Disposition: Discharge home. SRP discussed. Advise follow up with primary care provider within 24-72 hours.   FINAL  CLINICAL IMPRESSION(S) / ED DIAGNOSES   Final diagnoses:  Lower urinary tract infectious disease  Nausea, vomiting, and diarrhea   Rx / DC Orders   ED Discharge Orders          Ordered    ondansetron (ZOFRAN-ODT) 8 MG disintegrating tablet  Every 8 hours PRN        11/21/22 1018    cefdinir (OMNICEF) 300 MG capsule  2 times daily        11/21/22 1018           Note:  This document was prepared using Dragon voice recognition software and may include unintentional dictation errors.   Merwyn Katos, MD 11/21/22 1024

## 2022-11-21 NOTE — ED Triage Notes (Signed)
Pt here with post op complications and NVD since yesterday. Pt had back surgery on 10/23/2022. Pt states it is her inner ear also causing problems and making her "swimmy headed". Pt states some mild cramps when she goes to the bathroom but she is more nauseous than anything.

## 2022-12-05 ENCOUNTER — Ambulatory Visit
Admission: RE | Admit: 2022-12-05 | Discharge: 2022-12-05 | Disposition: A | Discharge status: Home / Self Care | Payer: Medicare Other | Source: Ambulatory Visit | Attending: Neurosurgery | Admitting: Neurosurgery

## 2022-12-05 ENCOUNTER — Other Ambulatory Visit: Payer: Self-pay

## 2022-12-05 ENCOUNTER — Encounter: Payer: Self-pay | Admitting: Neurosurgery

## 2022-12-05 ENCOUNTER — Ambulatory Visit
Admission: RE | Admit: 2022-12-05 | Discharge: 2022-12-05 | Disposition: A | Discharge status: Home / Self Care | Payer: Medicare Other | Attending: Neurosurgery | Admitting: Neurosurgery

## 2022-12-05 ENCOUNTER — Ambulatory Visit (INDEPENDENT_AMBULATORY_CARE_PROVIDER_SITE_OTHER): Payer: Medicare Other | Admitting: Neurosurgery

## 2022-12-05 VITALS — BP 130/78 | Temp 97.7°F | Ht 62.0 in | Wt 199.0 lb

## 2022-12-05 DIAGNOSIS — M4316 Spondylolisthesis, lumbar region: Secondary | ICD-10-CM | POA: Diagnosis present

## 2022-12-05 DIAGNOSIS — Z981 Arthrodesis status: Secondary | ICD-10-CM

## 2022-12-05 DIAGNOSIS — M5442 Lumbago with sciatica, left side: Secondary | ICD-10-CM

## 2022-12-05 NOTE — Progress Notes (Signed)
   REFERRING PHYSICIAN:  Oneal Grout, Fnp [address] [place],  [place] [address]  DOS: 10/23/22 L4-5 XLIF and PSF  HISTORY OF PRESENT ILLNESS: Taylor Morrow is status post lumbar fusion.  She is doing well.  Her pain is much improved.  PHYSICAL EXAMINATION:  General: Patient is well developed, well nourished, calm, collected, and in no apparent distress.   NEUROLOGICAL:  General: In no acute distress.   Awake, alert, oriented to person, place, and time.  Pupils equal round and reactive to light.  Strength:            Side Iliopsoas Quads Hamstring PF DF EHL  R 5 5 5 5 5 5   L 5 5 5 5 5 5    Incisions c/d/I and healing well   ROS (Neurologic):  Negative except as noted above  IMAGING: Lumbar x-rays from December 05, 2022 reviewed.  She has cemented screws at L4 and L5.  Her imaging findings appear to be stable.  ASSESSMENT/PLAN:  Taylor Morrow is doing very well after lumbar fusion.  We reviewed her activity limitations.  I am very pleased with her improvements.    We will see her back in clinic in approximately 6 weeks.  Venetia Night Department of neurosurgery

## 2022-12-18 ENCOUNTER — Telehealth: Payer: Self-pay

## 2022-12-18 NOTE — Telephone Encounter (Signed)
Patient left voicemail to schedule appointment patient stated that she has a refer with [name]. Return the call patient voicemail was full unable to leave message.

## 2023-01-10 ENCOUNTER — Other Ambulatory Visit: Payer: Self-pay

## 2023-01-10 DIAGNOSIS — M4316 Spondylolisthesis, lumbar region: Secondary | ICD-10-CM

## 2023-01-10 NOTE — Progress Notes (Deleted)
   REFERRING PHYSICIAN:  Oneal Grout, Fnp [address] [place],  [place] [address]  DOS: 10/23/22 L4-5 XLIF and PSF  HISTORY OF PRESENT ILLNESS:  01/11/23 Taylor Morrow is a 77 y.o who is about 3 weeks s/p lumbar fusion. Today she is ***  12/05/22 Taylor Morrow is status post lumbar fusion.  She is doing well.  Her pain is much improved.  PHYSICAL EXAMINATION:  General: Patient is well developed, well nourished, calm, collected, and in no apparent distress.   NEUROLOGICAL:  General: In no acute distress.   Awake, alert, oriented to person, place, and time.    Strength:            Side Iliopsoas Quads Hamstring PF DF EHL  R 5 5 5 5 5 5   L 5 5 5 5 5 5    Incisions ***   ROS (Neurologic):  Negative except as noted above  IMAGING: 01/11/23 lumbar xrays  Lumbar x-rays from December 05, 2022 reviewed.  She has cemented screws at L4 and L5.  Her imaging findings appear to be stable.  ASSESSMENT/PLAN:  Taylor Morrow is doing very well after lumbar fusion.   Taylor Charity PA-C Department of neurosurgery

## 2023-01-11 ENCOUNTER — Encounter: Payer: Medicare Other | Admitting: Neurosurgery

## 2023-02-19 NOTE — Progress Notes (Unsigned)
   REFERRING PHYSICIAN:  No referring provider defined for this encounter.  DOS: 10/23/22 L4-5 XLIF and PSF  HISTORY OF PRESENT ILLNESS: Taylor Morrow is over 3 months status post lumbar fusion.  She was doing well at her last visit.       She is doing well.  Her pain is much improved.  PHYSICAL EXAMINATION:  General: Patient is well developed, well nourished, calm, collected, and in no apparent distress.   NEUROLOGICAL:  General: In no acute distress.   Awake, alert, oriented to person, place, and time.  Pupils equal round and reactive to light.  Strength:            Side Iliopsoas Quads Hamstring PF DF EHL  R 5 5 5 5 5 5   L 5 5 5 5 5 5    Incisions well healed   ROS (Neurologic):  Negative except as noted above  IMAGING: Lumbar x-rays dated ***:  She has cemented screws at L4 and L5.  Her imaging findings appear to be stable.***  Radiology report not yet available.   ASSESSMENT/PLAN:  Taylor Morrow is doing very well after lumbar fusion.  We reviewed her activity limitations.  I am very pleased with her improvements.    We will see her back in clinic in approximately 6 weeks.  Drake Leach PA-C Department of neurosurgery

## 2023-02-20 ENCOUNTER — Ambulatory Visit (INDEPENDENT_AMBULATORY_CARE_PROVIDER_SITE_OTHER): Payer: Medicare Other | Admitting: Orthopedic Surgery

## 2023-02-20 ENCOUNTER — Encounter: Payer: Self-pay | Admitting: Orthopedic Surgery

## 2023-02-20 ENCOUNTER — Ambulatory Visit
Admission: RE | Admit: 2023-02-20 | Discharge: 2023-02-20 | Disposition: A | Discharge status: Home / Self Care | Payer: Medicare Other | Source: Ambulatory Visit | Attending: Neurosurgery | Admitting: Neurosurgery

## 2023-02-20 ENCOUNTER — Encounter: Payer: Medicare Other | Admitting: Neurosurgery

## 2023-02-20 VITALS — BP 126/82 | Temp 97.9°F | Ht 63.0 in | Wt 186.0 lb

## 2023-02-20 DIAGNOSIS — Z981 Arthrodesis status: Secondary | ICD-10-CM

## 2023-02-20 DIAGNOSIS — M4316 Spondylolisthesis, lumbar region: Secondary | ICD-10-CM

## 2023-02-20 DIAGNOSIS — Z09 Encounter for follow-up examination after completed treatment for conditions other than malignant neoplasm: Secondary | ICD-10-CM | POA: Diagnosis not present

## 2023-07-10 IMAGING — MR MR LUMBAR SPINE WO/W CM
6 of 7 series · 33 of 48 positions shown · IV contrast (10 ml Gadavist)
Comparison: CT 04/02/2021.  MRI 12/23/2018.

CLINICAL DATA: Low back pain with difficulty walking over the last
4 days.

EXAM:
MRI LUMBAR SPINE WITHOUT AND WITH CONTRAST
TECHNIQUE: Multiplanar and multiecho pulse sequences of the lumbar spine were
obtained without and with intravenous contrast.
CONTRAST:  10mL GADAVIST GADOBUTROL 1 MMOL/ML IV SOLN

[Series 9: T2 · sagittal · 4.0mm · 0.81mm/px · 4 of 16 slices shown (1 of 2)]
[im 1/16]
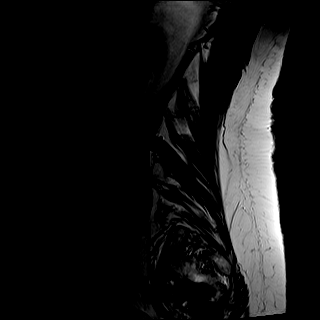
[im 6/16]
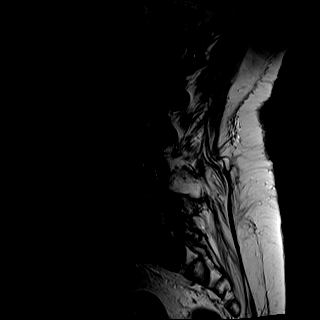
[im 11/16]
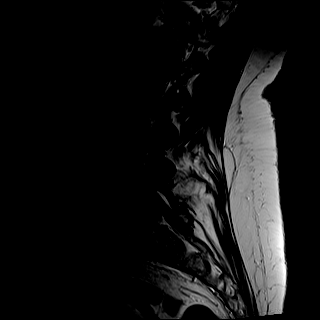
[im 16/16]
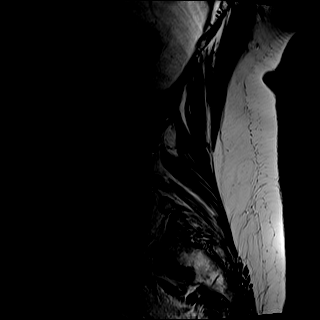

[Series 10: STIR · sagittal · 4.0mm · 0.51mm/px · 3 of 16 slices shown]
[im 1/16]
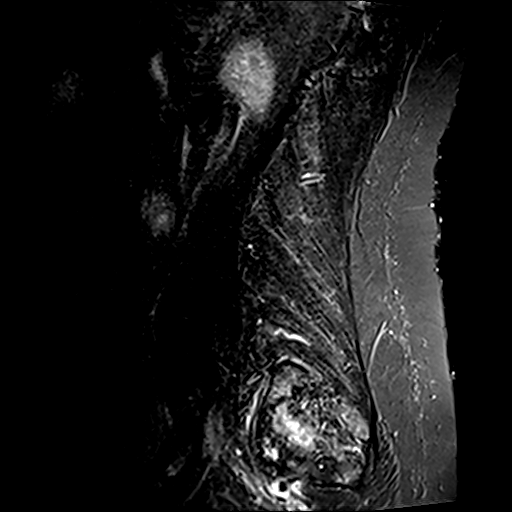
[im 6/16]
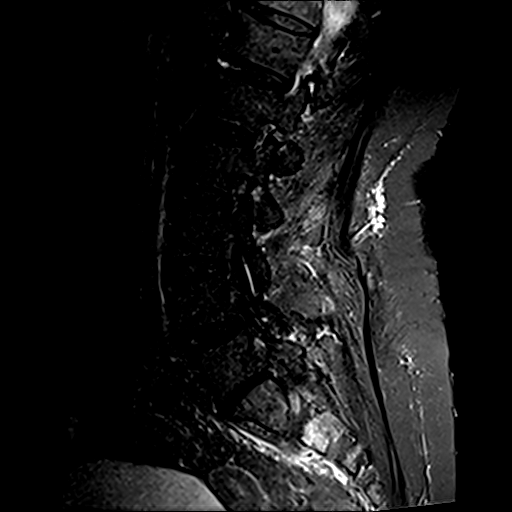
[im 11/16]
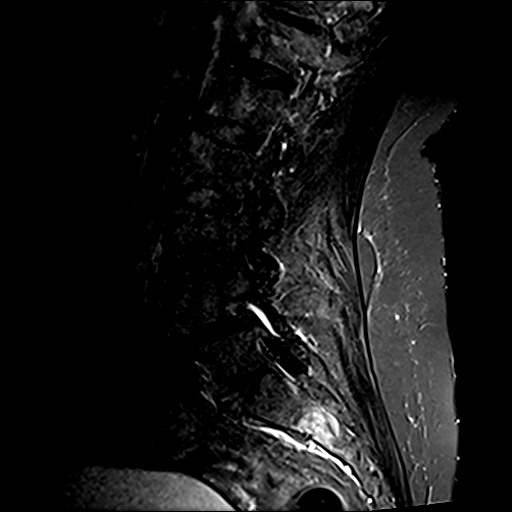

[Series 11: T1 · sagittal · 4.0mm · 1.02mm/px · 5 of 16 slices shown (1 of 2)]
[im 1/16]
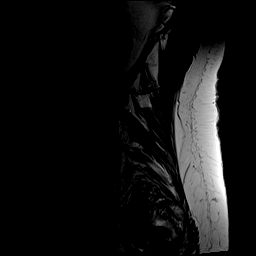
[im 4/16]
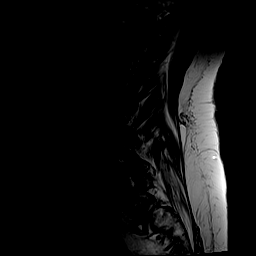
[im 8/16]
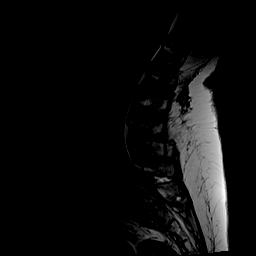
[im 12/16]
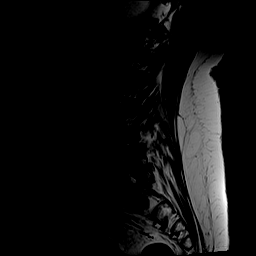
[im 16/16]
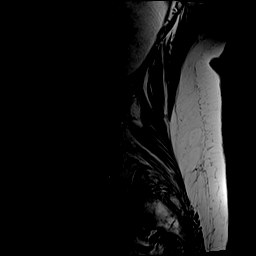

[Series 12: T2 · axial · 4.0mm · 0.70mm/px · z∈[-384,-178]mm · 8 of 34 slices shown (2 of 2)]
[im 1/34]
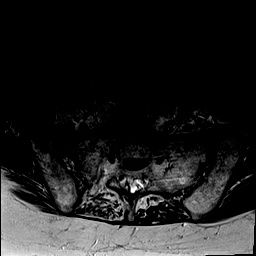
[im 4/34]
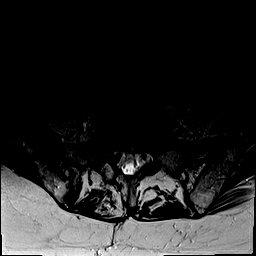
[im 12/34]
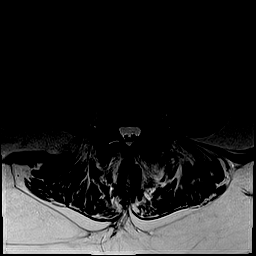
[im 15/34]
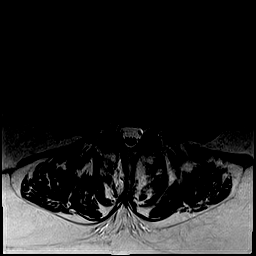
[im 19/34]
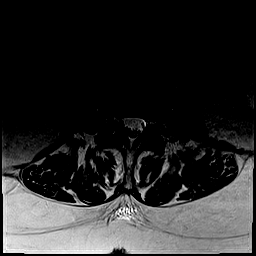
[im 23/34]
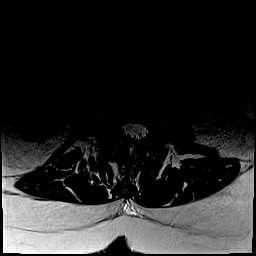
[im 30/34]
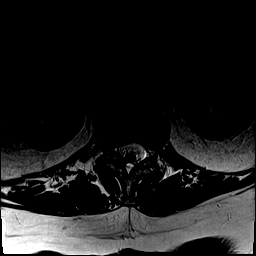
[im 34/34]
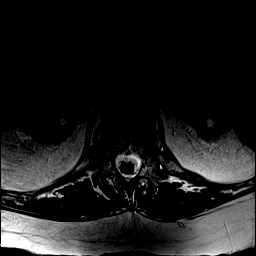

[Series 13: T1 · axial · 4.0mm · 0.35mm/px · z∈[-384,-178]mm · 8 of 34 slices shown (2 of 2)]
[im 1/34]
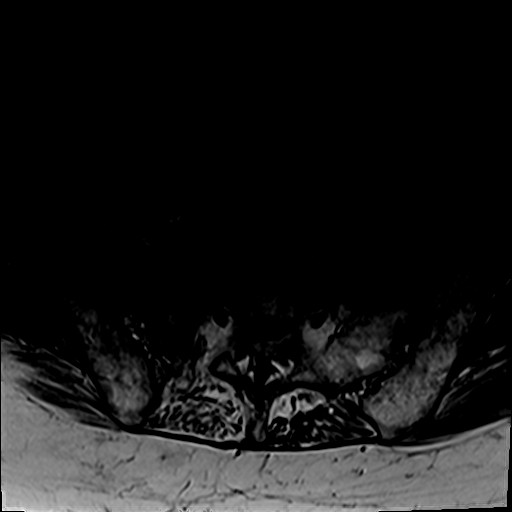
[im 4/34]
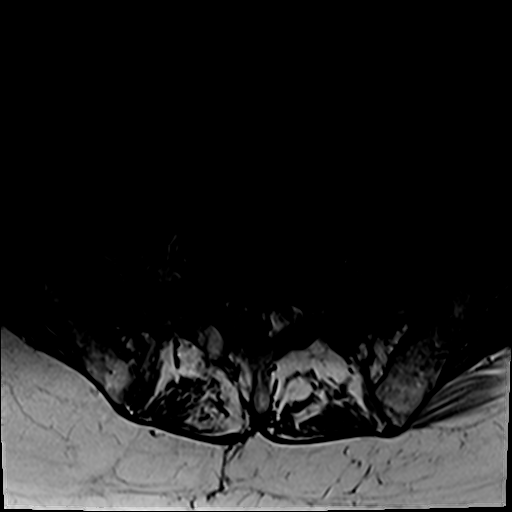
[im 12/34]
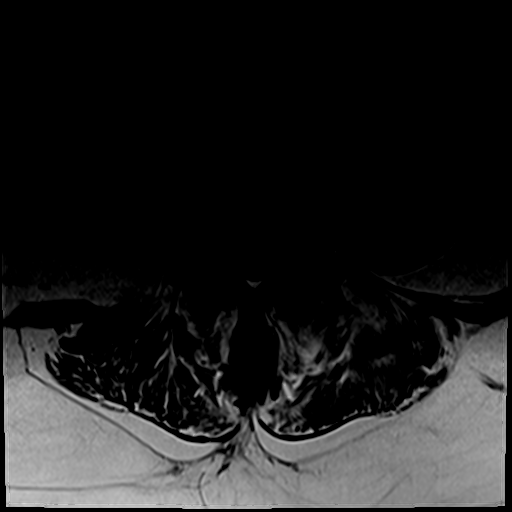
[im 15/34]
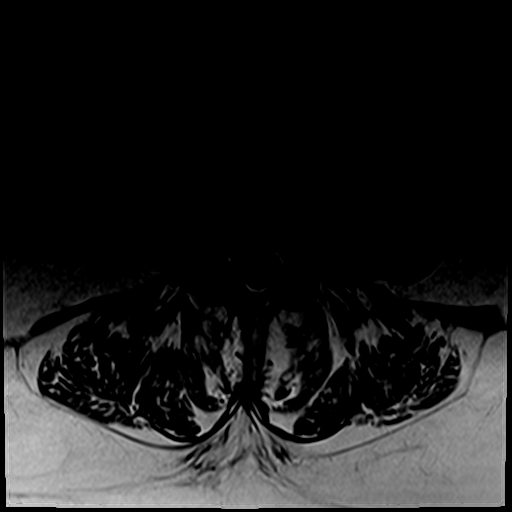
[im 19/34]
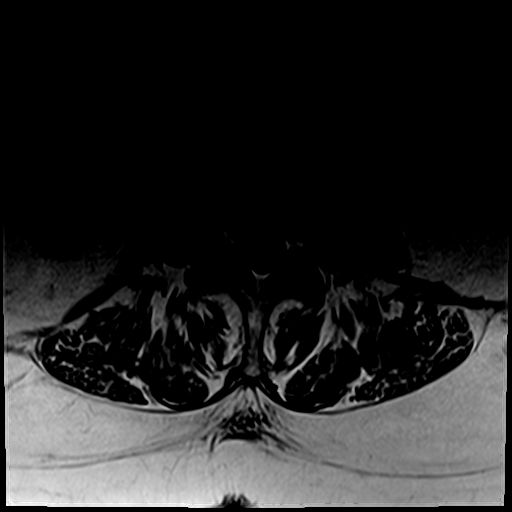
[im 23/34]
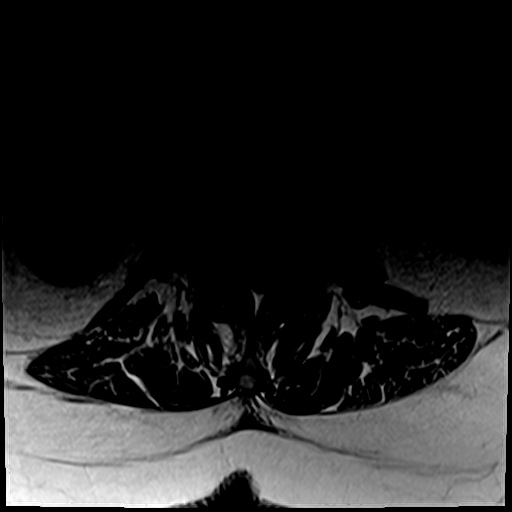
[im 30/34]
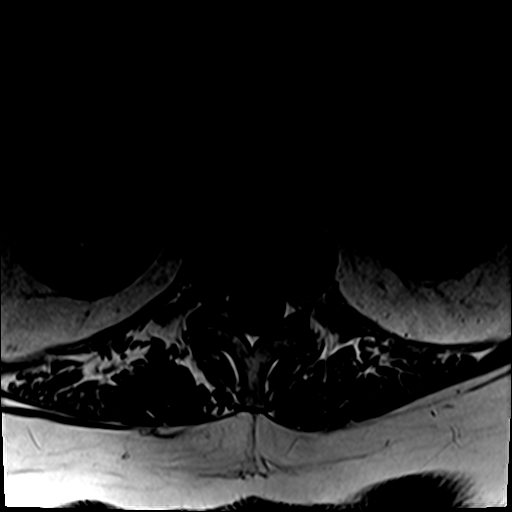
[im 34/34]
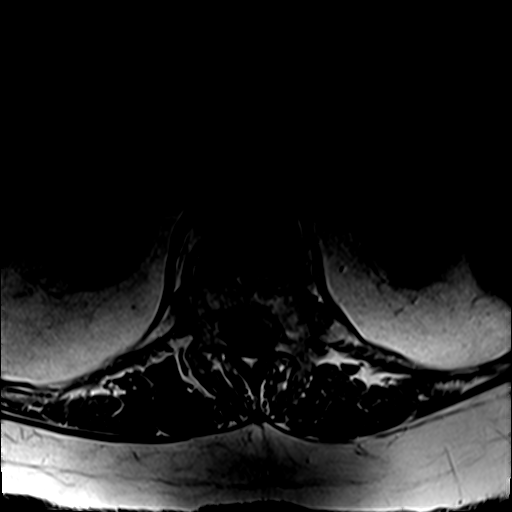

[Series 14: T1 fat-sat post-contrast · sagittal · 4.0mm · 1.02mm/px · 5 of 16 slices shown]
[im 1/16]
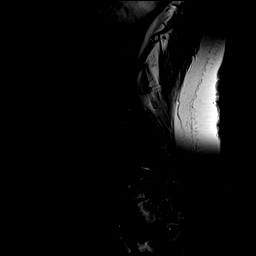
[im 4/16]
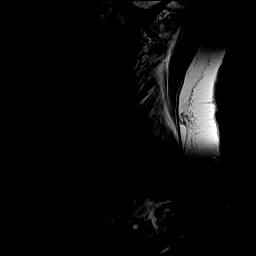
[im 8/16]
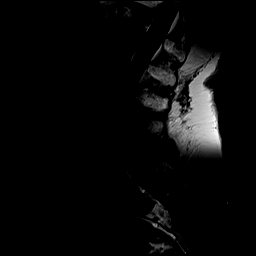
[im 12/16]
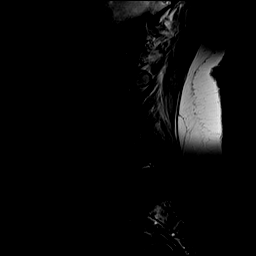
[im 16/16]
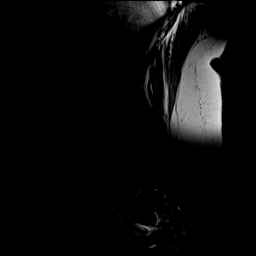

[33 of 48 positions shown; findings below may reference images not displayed]

FINDINGS: Segmentation: 5 lumbar type vertebral bodies as numbered previously.

Alignment:  3 mm degenerative anterolisthesis L4-5.

Vertebrae: Old minor superior endplate fracture or Schmorl's node at
T12. Sacral fracture crossing from right to left at the S2 level.

Conus medullaris and cauda equina: Conus extends to the L1 level.
Conus and cauda equina appear normal.

Paraspinal and other soft tissues: Negative

Disc levels:

No significant finding at L3-4 or above. Minimal non-compressive
disc bulges.

L4-5: Bilateral facet arthropathy with fluid-filled joints. 3 mm of
degenerative anterolisthesis that would likely worsen with standing
or flexion. Bulging of the disc. Moderate multifactorial stenosis
which would likely worsen with standing or flexion. Left foraminal
narrowing that could compress the exiting L4 nerve.

L5-S1: Distant posterior decompression. No stenosis of the canal or
foramina.
IMPRESSION: Sacral insufficiency fractures, crossing at the S2 level.

Moderate multifactorial stenosis at the L4-5 level that would likely
worsen with standing or flexion. Facet arthropathy with fluid-filled
joints. 3 mm of anterolisthesis. Bulging of the disc. Foraminal
stenosis on the left that could additionally compress the L4 nerve.

## 2023-07-24 ENCOUNTER — Ambulatory Visit: Payer: Medicare PPO | Admitting: Neurosurgery

## 2023-08-14 ENCOUNTER — Ambulatory Visit: Payer: Medicare PPO | Admitting: Neurosurgery
